# Patient Record
Sex: Female | Born: 1955 | Race: Black or African American | Hispanic: No | Marital: Married
Health system: Midwestern US, Community
[De-identification: ages and names within clinical notes are randomized; demographics above are authoritative.]

## PROBLEM LIST (undated history)

## (undated) DIAGNOSIS — Z124 Encounter for screening for malignant neoplasm of cervix: Secondary | ICD-10-CM

## (undated) DIAGNOSIS — E7800 Pure hypercholesterolemia, unspecified: Secondary | ICD-10-CM

## (undated) DIAGNOSIS — R928 Other abnormal and inconclusive findings on diagnostic imaging of breast: Secondary | ICD-10-CM

## (undated) DIAGNOSIS — M6282 Rhabdomyolysis: Secondary | ICD-10-CM

## (undated) DIAGNOSIS — I10 Essential (primary) hypertension: Secondary | ICD-10-CM

## (undated) DIAGNOSIS — F439 Reaction to severe stress, unspecified: Secondary | ICD-10-CM

## (undated) DIAGNOSIS — E785 Hyperlipidemia, unspecified: Secondary | ICD-10-CM

## (undated) DIAGNOSIS — E789 Disorder of lipoprotein metabolism, unspecified: Secondary | ICD-10-CM

## (undated) HISTORY — DX: Disorder of lipoprotein metabolism, unspecified: E78.9

## (undated) HISTORY — DX: Reaction to severe stress, unspecified: F43.9

## (undated) HISTORY — DX: Hyperlipidemia, unspecified: E78.5

## (undated) MED ORDER — PRAVASTATIN 20 MG TAB
20 mg | ORAL_TABLET | ORAL | Status: DC
Start: ? — End: 2013-04-20

## (undated) MED ORDER — CYCLOBENZAPRINE 10 MG TAB
10 mg | ORAL_TABLET | Freq: Every day | ORAL | Status: DC | PRN
Start: ? — End: 2012-10-07

## (undated) MED ORDER — METOPROLOL SUCCINATE SR 25 MG 24 HR TAB
25 mg | ORAL_TABLET | Freq: Every day | ORAL | Status: DC
Start: ? — End: 2014-04-22

## (undated) MED ORDER — METOPROLOL SUCCINATE SR 25 MG 24 HR TAB
25 mg | ORAL_TABLET | Freq: Every day | ORAL | Status: DC
Start: ? — End: 2013-04-20

## (undated) MED ORDER — PRAVASTATIN 20 MG TAB
20 mg | ORAL_TABLET | Freq: Every evening | ORAL | Status: DC
Start: ? — End: 2012-11-05

---

## 2001-04-15 HISTORY — PX: BURN DEBRIDEMENT SURGERY: SHX582

## 2008-06-25 LAB — METABOLIC PANEL, COMPREHENSIVE
A-G Ratio: 1 — ABNORMAL LOW (ref 1.1–2.2)
ALT (SGPT): 32 U/L (ref 30–65)
AST (SGOT): 14 U/L — ABNORMAL LOW (ref 15–37)
Albumin: 3.2 g/dL — ABNORMAL LOW (ref 3.5–5.0)
Alk. phosphatase: 111 U/L (ref 50–136)
Anion gap: 3 mmol/L — ABNORMAL LOW (ref 5–15)
BUN/Creatinine ratio: 14 (ref 12–20)
BUN: 13 MG/DL (ref 6–20)
Bilirubin, total: 0.2 MG/DL (ref 0.2–1.0)
CO2: 28 MMOL/L (ref 21–32)
Calcium: 8.3 MG/DL — ABNORMAL LOW (ref 8.5–10.1)
Chloride: 111 MMOL/L — ABNORMAL HIGH (ref 97–108)
Creatinine: 0.9 MG/DL (ref 0.6–1.3)
GFR est AA: >60 mL/min/{1.73_m2} (ref >60)
GFR est non-AA: >60 mL/min/{1.73_m2} (ref >60)
Globulin: 3.3 g/dL (ref 2.0–4.0)
Glucose: 90 MG/DL (ref 50–100)
Potassium: 4.1 MMOL/L (ref 3.5–5.1)
Protein, total: 6.5 g/dL (ref 6.4–8.2)
Sodium: 142 MMOL/L (ref 136–145)

## 2008-06-25 LAB — CBC WITH AUTOMATED DIFF
ABS. BASOPHILS: 0 10*3/uL (ref 0.0–0.1)
ABS. EOSINOPHILS: 0.1 10*3/uL (ref 0.0–0.4)
ABS. LYMPHOCYTES: 2.5 10*3/uL (ref 0.8–3.5)
ABS. MONOCYTES: 0.4 10*3/uL (ref 0.0–1.0)
ABS. NEUTROPHILS: 2.9 10*3/uL (ref 1.8–8.0)
BASOPHILS: 0 % (ref 0–1)
EOSINOPHILS: 2 % (ref 0–7)
HCT: 34.5 % — ABNORMAL LOW (ref 35.0–47.0)
HGB: 10.8 g/dL — ABNORMAL LOW (ref 11.5–16.0)
LYMPHOCYTES: 42 % (ref 12–49)
MCH: 28.9 PG (ref 26.0–34.0)
MCHC: 31.3 g/dL (ref 30.0–36.5)
MCV: 92.2 FL (ref 80.0–99.0)
MONOCYTES: 7 % (ref 5–13)
NEUTROPHILS: 49 % (ref 32–75)
PLATELET: 188 10*3/uL (ref 150–400)
RBC: 3.74 M/uL — ABNORMAL LOW (ref 3.80–5.20)
RDW: 12.8 % (ref 11.5–14.5)
WBC: 6 10*3/uL (ref 3.6–11.0)

## 2008-06-25 LAB — CK W/ REFLX CKMB: CK: 168 U/L (ref 21–215)

## 2008-06-25 LAB — TROPONIN I: Troponin-I, Qt.: <0.04 ng/mL (ref 0.0–0.05)

## 2008-06-25 NOTE — H&P (Signed)
Name: MIKE, BERNTSEN Admitted: 06/25/2008  MR #: [id number] DOB: 05-Dec-1955  Account #: [id number] Age: 53  Physician: Butch Penny, M.D. Location: [id number]     HISTORY PHYSICAL      CHIEF COMPLAINT: Chest pain.    HISTORY OF PRESENT ILLNESS: This is a 53 year old female with a family  history of early cardiovascular death (her brother died at age 92 from a  massive myocardial infarction), who came to the emergency room today with  sudden onset of chest discomfort that occurred with activity. She had  recently seen Dr. Alben Spittle at [facility] and underwent an exercise  stress echo on 06/20/2008. That study was remarkable for lateral and  anterior wall hypokinesis noted in the post-exercise imaging portion of the  test. The resting images were normal. She did experience some chest  discomfort in the recovery.    Currently, she pain free. Her chest discomfort earlier was central to left  sided in location, intense, and associated with left arm discomfort and  nausea. The duration was less than 1 hour. By the time she saw the  emergency medical crew, she was without chest discomfort, though found to  be quite hypertensive with a blood pressure of over 170/110.    PAST MEDICAL HISTORY: As above.    ALLERGIES: PENICILLIN.    MEDICATIONS: None.    SOCIAL HISTORY: No tobacco, alcohol, or illicit drug use stated. Her  children are present in the room on interview.    FAMILY HISTORY: Positive as above for cardiac death.    PHYSICAL EXAMINATION:  VITAL SIGNS: Temperature 98.2 Fahrenheit, pulse 74, blood pressure 133/94,  respirations 18, O2 saturation is 100% on room air.  GENERAL: Nondiaphoretic, but not in acute distress, middle-aged female.  NECK: Supple. No palpable thyromegaly.  HEENT: No scleral icterus, mucous membranes are moist, conjunctivae are  pink.  RESPIRATORY: Unlabored, clear to auscultation bilaterally.  CARDIAC: Regular rate and rhythm. No murmur, rub, or gallop. No JVD or   peripheral edema. No carotid bruit. Palpable radial pulses bilaterally. Marland Kitchen  EXTREMITIES: No cyanosis or clubbing.  NEUROLOGIC: Awake, appropriate, grossly nonfocal. No resting tremor.    LABORATORY DATA: White blood cells 6.0, hematocrit 35, platelets 188,  sodium 142, potassium 4.1, chloride 111, bicarbonate 28, BUN 18, creatinine  0.9, glucose 90. Hepatic panel grossly normal. CK 168, troponin less than  0.04.    ECG: Sinus rhythm, normal ECG.    TELEMETRY: No events.    CHEST X-RAY: No acute disease.    IMPRESSION:  1. Chest pain syndrome, possibly due to myocardial ischemia. This would  make this unstable angina.  2. History of positive stress echo with anterior and lateral hypokinesis  in the recovery period as well as chest pain associated with this. Cardiac  catheterization would be the next logical step.  3. Hypertension, severe, resolved.  4. Family history of early cardiovascular death.    RECOMMENDATIONS:  1. Will initiate aspirin and beta-blocker therapy.  2. Continue oxygen by nasal cannula.  3. Nitroglycerin sublingual as needed.  4. Lovenox 1 mg/kg subcu q.12 h.  5. Will check a fasting lipid panel and start statin therapy as  appropriate.  6. Cycle cardiac markers.  7. I would plan on a cardiac catheterization in the next 24 to 48 hours.        E-Signed By  Butch Penny, M.D. 07/07/2008 22:52    Butch Penny, M.D.    cc: Butch Penny, M.D.   Myrene Buddy  Sidonie Dickens, M.D.        DAS/WMX; D: 06/25/2008 5:58 P; T: 06/25/2008 8:55 P; DOC# [id number]; Job#  [id number]

## 2008-06-26 LAB — CK: CK: 154 U/L (ref 21–215)

## 2008-06-26 LAB — TROPONIN I: Troponin-I, Qt.: <0.04 ng/mL (ref 0.0–0.05)

## 2008-06-27 LAB — TROPONIN I: Troponin-I, Qt.: <0.04 ng/mL (ref 0.0–0.05)

## 2008-06-27 LAB — METABOLIC PANEL, BASIC
Anion gap: 3 mmol/L — ABNORMAL LOW (ref 5–15)
BUN/Creatinine ratio: 17 (ref 12–20)
BUN: 17 MG/DL (ref 6–20)
CO2: 30 MMOL/L (ref 21–32)
Calcium: 8.9 MG/DL (ref 8.5–10.1)
Chloride: 107 MMOL/L (ref 97–108)
Creatinine: 1 MG/DL (ref 0.6–1.3)
GFR est AA: >60 mL/min/{1.73_m2} (ref >60)
GFR est non-AA: >60 mL/min/{1.73_m2} (ref >60)
Glucose: 100 MG/DL (ref 50–100)
Potassium: 3.8 MMOL/L (ref 3.5–5.1)
Sodium: 140 MMOL/L (ref 136–145)

## 2008-06-27 LAB — LIPID PANEL
CHOL/HDL Ratio: 4.6 (ref 0–5.0)
Cholesterol, total: 183 MG/DL (ref <200)
HDL Cholesterol: 40 MG/DL (ref 40–60)
LDL, calculated: 118 MG/DL — ABNORMAL HIGH (ref 0–100)
Triglyceride: 125 MG/DL (ref 30–200)
VLDL, calculated: 25 MG/DL

## 2008-06-27 LAB — CK: CK: 87 U/L (ref 21–215)

## 2008-06-28 NOTE — Discharge Summary (Signed)
Name: Melody Harrison, Melody Harrison Admitted: 06/25/2008  MR #: [id number] Discharged: 06/27/2008  Account #: [id number] DOB: 08-11-1955  Physician: Radene Ou, MD Age 53     DISCHARGE SUMMARY      PRIMARY DIAGNOSES  1. Chest pain, with cardiac catheterization showing angiographically  normal coronary arteries.  2. Normal left ventricular function.  3. Sinus rhythm.  4. Newly diagnosed hypertension.  5. Dyslipidemia.  6. Strong family history of coronary disease.  7. Tobacco use.    DISCHARGE MEDICATIONS  1. Aspirin 81 mg daily.  2. Metoprolol sustained release 25 mg daily.  3. Pravachol 20 mg daily.  4. Pepcid AC as needed.    DISCHARGE DIET: Low fat.    DISCHARGE INSTRUCTIONS: The patient was advised no strenuous activity x3  days. The patient is asked to report fever, pain, or bleeding, and he is  advised on tobacco cessation.    The patient will follow up with Dr. Radene Ou in 7 to 10 days.    HOSPITAL COURSE: Please see full history and physical dictated on  06/25/2008; however, briefly, the patient is a 89 year old whose brother  recently died from cardiac disease. She has been under a fair amount of  stress at work and has been complaining of chest discomfort. She had a  stress echocardiogram, which was concerning for possible lateral ischemia,  and came to the hospital with worsening chest pain. She had no diagnostic  EKG changes of ischemia, and negative cardiac enzymes, and stayed in the  hospital for an elective cardiac catheterization. Her cardiac  catheterization was significant for angiographically normal coronary  arteries with preserved left ventricular function. Post-procedure, her  course was uncomplicated. The patient will continue aggressive medical  care.    PROCEDURES: Cardiac catheterization, dictated separately.        Reviewed on 06/28/2008 2:48 PM        E-Signed By  Radene Ou, MD 08/07/2008 21:01    Radene Ou, MD    cc: Radene Ou, MD         SR/WMX; D: 06/27/2008 11:26 A; T: 06/28/2008 11:23 A; DOC# [id number]; Job#  [id number]

## 2011-06-05 MED ORDER — METOPROLOL SUCCINATE SR 25 MG 24 HR TAB
25 mg | ORAL_TABLET | Freq: Every day | ORAL | Status: DC
Start: 2011-06-05 — End: 2012-08-24

## 2011-06-05 MED ORDER — METOPROLOL SUCCINATE SR 25 MG 24 HR TAB
25 mg | ORAL_TABLET | Freq: Every day | ORAL | Status: DC
Start: 2011-06-05 — End: 2011-06-05
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
## 2011-06-05 NOTE — Patient Instructions (Addendum)
MyChart Activation    Thank you for requesting access to MyChart. Please follow the instructions below to securely access and download your online medical record. MyChart allows you to send messages to your doctor, view your test results, renew your prescriptions, schedule appointments, and more.    How Do I Sign Up?    1. In your internet browser, go to [email or web address]  2. Click on the First Time User? Click Here link in the Sign In box. You will be redirect to the New Member Sign Up page.  3. Enter your MyChart Access Code exactly as it appears below. You will not need to use this code after you???ve completed the sign-up process. If you do not sign up before the expiration date, you must request a new code.    MyChart Access Code: [email or web address]  Expires: 09/03/2011  2:18 PM (This is the date your MyChart access code will expire)    4. Enter the last four digits of your Social Security Number (xxxx) and Date of Birth (mm/dd/yyyy) as indicated and click Submit. You will be taken to the next sign-up page.  5. Create a MyChart ID. This will be your MyChart login ID and cannot be changed, so think of one that is secure and easy to remember.  6. Create a MyChart password. You can change your password at any time.  7. Enter your Password Reset Question and Answer. This can be used at a later time if you forget your password.   8. Enter your e-mail address. You will receive e-mail notification when new information is available in MyChart.  9. Click Sign Up. You can now view and download portions of your medical record.  10. Click the Download Summary menu link to download a portable copy of your medical information.    Additional Information    If you have questions, please visit the Frequently Asked Questions section of the MyChart website at [email or web address] Remember, MyChart is NOT to be used for urgent needs. For medical emergencies, dial 911.        Earwax Blockage: After Your Visit   Your Care Instructions  Earwax is a natural substance that protects the ear canal. Normally, earwax drains from the ears and does not cause problems. Sometimes earwax builds up and hardens. Earwax blockage (also called cerumen impaction) can cause some loss of hearing and pain. When wax is tightly packed, you will need to have your doctor remove it.  Follow-up care is a key part of your treatment and safety. Be sure to make and go to all appointments, and call your doctor if you are having problems. It???s also a good idea to know your test results and keep a list of the medicines you take.  How can you care for yourself at home?  ?? Do not try to remove earwax with cotton swabs, fingers, or other objects. This can make the blockage worse and damage the eardrum.   ?? If your doctor recommends that you try to remove earwax at home:   ?? Soften and loosen the earwax with warm mineral oil. You also can try hydrogen peroxide mixed with an equal amount of room temperature water. Place 2 drops of the fluid, warmed to body temperature, in the ear two times a day for up to 5 days.   ?? Once the wax is loose and soft, all that is usually needed to remove it from the ear canal is a gentle, warm shower. Direct the water into the ear,  then tip your head to let the earwax drain out. Dry your ear thoroughly with a hair dryer set on low. Hold the dryer several inches from your ear.   ?? If the warm mineral oil and shower do not work, use an over-the-counter wax softener followed by gentle flushing with an ear syringe each night for a week or two. Make sure the flushing solution is body temperature. Cool or hot fluids in the ear can cause dizziness.   When should you call for help?  Call your doctor now or seek immediate medical care if:  ?? Pus or blood drains from your ear.   ?? Your ears are ringing or feel full.   ?? You have a loss of hearing.   Watch closely for changes in your health, and be sure to contact your doctor if:  ?? You  have pain or reduced hearing after 1 week of home treatment.   ?? You have any new symptoms, such as nausea or balance problems.     Where can you learn more?    Go to [email or web address]   Enter Q495 in the search box to learn more about "Earwax Blockage: After Your Visit."    ?? 2006-2012 Healthwise, Incorporated. Care instructions adapted under license by [employer] (which disclaims liability or warranty for this information). This care instruction is for use with your licensed healthcare professional. If you have questions about a medical condition or this instruction, always ask your healthcare professional. Healthwise, Incorporated disclaims any warranty or liability for your use of this information.  Content Version: 9.5.76532; Last Revised: July 10, 2009

## 2011-06-05 NOTE — Progress Notes (Signed)
Pt is here to establish care.

## 2011-08-06 LAB — AMB POC URINALYSIS DIP STICK AUTO W/O MICRO
Bilirubin (UA POC): NEGATIVE
Glucose (UA POC): NEGATIVE
Ketones (UA POC): NEGATIVE
Nitrites (UA POC): POSITIVE
Protein (UA POC): NEGATIVE mg/dL
Specific gravity (UA POC): 1.02 (ref 1.001–1.035)
Urobilinogen (UA POC): 0.2 (ref 0.2–1)
pH (UA POC): 5.5 (ref 4.6–8.0)

## 2011-08-06 MED ORDER — TRIMETHOPRIM-SULFAMETHOXAZOLE 160 MG-800 MG TAB
160-800 mg | ORAL_TABLET | Freq: Two times a day (BID) | ORAL | Status: AC
Start: 2011-08-06 — End: 2011-08-11

## 2011-08-06 NOTE — Progress Notes (Signed)
Patient c/o urinary burning with urination since yesterday.
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
## 2011-08-06 NOTE — Patient Instructions (Signed)
Urinary Tract Infection in Women: After Your Visit  Your Care Instructions  A urinary tract infection, or UTI, is a general term for an infection anywhere between the kidneys and the urethra (where urine comes out). Most UTIs are bladder infections. They often cause pain or burning when you urinate.  UTIs are caused by bacteria and can be cured with antibiotics. Be sure to complete your treatment so that the infection goes away.  Follow-up care is a key part of your treatment and safety. Be sure to make and go to all appointments, and call your doctor if you are having problems. It???s also a good idea to know your test results and keep a list of the medicines you take.  How can you care for yourself at home?  ?? Take your antibiotics as directed. Do not stop taking them just because you feel better. You need to take the full course of antibiotics.  ?? Drink extra water and other fluids for the next day or two. This may help wash out the bacteria that are causing the infection. (If you have kidney, heart, or liver disease and have to limit fluids, talk with your doctor before you increase your fluid intake.)  ?? Avoid drinks that are carbonated or have caffeine. They can irritate the bladder.  ?? Urinate often. Try to empty your bladder each time.  ?? To relieve pain, take a hot bath or lay a heating pad set on low over your lower belly or genital area. Never go to sleep with a heating pad in place.  To prevent UTIs  ?? Drink plenty of water each day. This helps you urinate often, which clears bacteria from your system. (If you have kidney, heart, or liver disease and have to limit fluids, talk with your doctor before you increase your fluid intake.)  ?? Consider adding cranberry juice to your diet.  ?? Urinate when you need to.  ?? Urinate right after you have sex.  ?? Change sanitary pads often.  ?? Avoid douches, bubble baths, feminine hygiene sprays, and other feminine hygiene products that have deodorants.  ?? After going  to the bathroom, wipe from front to back.  When should you call for help?  Call your doctor now or seek immediate medical care if:  ?? Symptoms such as fever, chills, nausea, or vomiting get worse or appear for the first time.  ?? You have new pain in your back just below your rib cage. This is called flank pain.  ?? There is new blood or pus in your urine.  ?? You have any problems with your antibiotic medicine.  Watch closely for changes in your health, and be sure to contact your doctor if:  ?? You are not getting better after 1 day (24 hours).  ?? Your symptoms go away but then come back.   Where can you learn more?   Go to http://www.healthwise.net/[facility]  Enter K848 in the search box to learn more about "Urinary Tract Infection in Women: After Your Visit."   ?? 2006-2013 Healthwise, Incorporated. Care instructions adapted under license by [employer] (which disclaims liability or warranty for this information). This care instruction is for use with your licensed healthcare professional. If you have questions about a medical condition or this instruction, always ask your healthcare professional. Healthwise, Incorporated disclaims any warranty or liability for your use of this information.  Content Version: 9.6.101520; Last Revised: Aug 29, 2009

## 2011-08-10 LAB — CULTURE, URINE

## 2011-08-11 NOTE — Progress Notes (Signed)
Quick Note:    Please call patient. UC is growing out 2 bacteria. 1 is susceptible to current med (bactrim) and other did not have sensitivities. Hopefully she is feeling better. I tried calling her over the weekend but have not spoken to her.  ______

## 2011-08-12 NOTE — Progress Notes (Signed)
Quick Note:    LMTCB  ______

## 2011-08-13 NOTE — Progress Notes (Signed)
Quick Note:    LMTCB on private vmb.  ______

## 2011-08-20 NOTE — Progress Notes (Signed)
Quick Note:    LMTCB  ______

## 2011-08-22 NOTE — Progress Notes (Signed)
Quick Note:    Okay to send letter.  Notify her she is grew 2 bacteria. She should be feeling better if not should contact us.    Mailed letter to pt's upon Dr.Hayes' approval, regarding recent urine cx.  ______

## 2011-10-02 ENCOUNTER — Other Ambulatory Visit

## 2011-10-02 MED ORDER — CELECOXIB 200 MG CAP
200 mg | ORAL_CAPSULE | Freq: Every day | ORAL | Status: DC
Start: 2011-10-02 — End: 2012-04-01

## 2011-10-02 MED ORDER — CELECOXIB 200 MG CAP
200 mg | ORAL_CAPSULE | Freq: Every day | ORAL | Status: DC
Start: 2011-10-02 — End: 2011-10-02
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
## 2011-10-02 NOTE — Patient Instructions (Addendum)
I am suggesting people try to get in with another partner--Dr. Deanna Artis or Dr. Cliffton Asters. We do not have anyone to take over for me at this point, so the availability is limited. If you really want to stay in the practice, I recommend calling sooner than later to get a "re-establish" appointment on the books ASAP. Otherwise, please consider [facility], or [facility]. These are quality practices, in [facility], so all your records will be available.      Dr. Samuel Bouche, Dr. Thresa Ross, Dr. Sherryll Burger  [facility]  Phone: [phone]    [place]: Dr. [facility]  Phone: [phone]    [facility]--Dr. Jackolyn Confer, Dr. Emelia Salisbury  Phone: [phone]   [address]   [address]   [place] [address]      Low Back Pain: Exercises  Your Care Instructions  Here are some examples of typical rehabilitation exercises for your condition. Start each exercise slowly. Ease off the exercise if you start to have pain.  Your doctor or physical therapist will tell you when you can start these exercises and which ones will work best for you.  How to do the exercises  Press-up    1. Lie on your stomach, supporting your body with your forearms.   2. Press your elbows down into the floor to raise your upper back. As you do this, relax your stomach muscles and allow your back to arch without using your back muscles. As your press up, do not let your hips or pelvis come off the floor.   3. Hold for 15 to 30 seconds, then relax.   4. Repeat 2 to 4 times.   Alternate arm and leg (bird dog) exercise    Note: Do this exercise slowly. Try to keep your body straight at all times, and do not let one hip drop lower than the other.  1. Start on the floor, on your hands and knees.   2. Tighten your belly muscles.   3. Raise one leg off the floor, and hold it straight out behind  you. Be careful not to let your hip drop down, because that will twist your trunk.   4. Hold for about 6 seconds, then lower your leg and switch to the other leg.   5. Repeat 8 to 12 times on each leg.   6. Over time, work up to holding for 10 to 30 seconds each time.   7. If you feel stable and secure with your leg raised, try raising the opposite arm straight out in front of you at the same time.   Knee-to-chest exercise    1. Lie on your back with your knees bent and your feet flat on the floor.   2. Bring one knee to your chest, keeping the other foot flat on the floor (or keeping the other leg straight, whichever feels better on your lower back).   3. Keep your lower back pressed to the floor. Hold for at least 15 to 30 seconds.   4. Relax, and lower the knee to the starting position.   5. Repeat with the other leg. Repeat 2 to 4 times with each leg.   6. To get more stretch, put your other leg flat on the floor while pulling your knee to your chest.   Curl-ups    1. Lie on the floor on your back  with your knees bent at a 90-degree angle. Your feet should be flat on the floor, about 12 inches from your buttocks.   2. Cross your arms over your chest.   3. Slowly tighten your belly muscles and raise your shoulder blades off the floor.   4. Keep your head in line with your body, and do not press your chin to your chest.   5. Hold this position for 1 or 2 seconds, then slowly lower yourself back down to the floor.   6. Repeat 8 to 12 times.   Pelvic tilt exercise    1. Lie on your back with your knees bent.   2. "Brace" your stomach. This means to tighten your muscles by pulling in and imagining your belly button moving toward your spine. You should feel like your back is pressing to the floor and your hips and pelvis are rocking back.   3. Hold for about 6 seconds while you breathe smoothly.   4. Repeat 8 to 12 times.   Heel dig bridging    1. Lie on your back with both knees bent and your ankles bent so that only  your heels are digging into the floor. Your knees should be bent about 90 degrees.   2. Then push your heels into the floor, squeeze your buttocks, and lift your hips off the floor until your shoulders, hips, and knees are all in a straight line.   3. Hold for about 6 seconds as you continue to breathe normally, and then slowly lower your hips back down to the floor and rest for up to 10 seconds.   4. Do 8 to 12 repetitions.   Hamstring stretch in doorway    1. Lie on your back in a doorway, with one leg through the open door.   2. Slide your leg up the wall to straighten your knee. You should feel a gentle stretch down the back of your leg.   3. Hold the stretch for at least 15 to 30 seconds. Do not arch your back, point your toes, or bend either knee. Keep one heel touching the floor and the other heel touching the wall.   4. Repeat with your other leg.   5. Do 2 to 4 times for each leg.   Hip flexor stretch    1. Kneel on the floor with one knee bent and one leg behind you. Place your forward knee over your foot. Keep your other knee touching the floor.   2. Slowly push your hips forward until you feel a stretch in the upper thigh of your rear leg.   3. Hold the stretch for at least 15 to 30 seconds. Repeat with your other leg.   4. Do 2 to 4 times on each side.   Wall sit    1. Stand with your back 10 to 12 inches away from a wall.   2. Lean into the wall until your back is flat against it.   3. Slowly slide down until your knees are slightly bent, pressing your lower back into the wall.   4. Hold for about 6 seconds, then slide back up the wall.   5. Repeat 8 to 12 times.   Follow-up care is a key part of your treatment and safety. Be sure to make and go to all appointments, and call your doctor if you are having problems. It's also a good idea to know your test results and keep a list of  the medicines you take.    Where can you learn more?    Go to [facility]   Enter [phone] in the  search box to learn more about "Low Back Pain: Exercises."    ?? 2006-2013 [employer]. Care instructions adapted under license by [employer] (which disclaims liability or warranty for this information). This care instruction is for use with your licensed healthcare professional. If you have questions about a medical condition or this instruction, always ask your healthcare professional. [employer], Inc[employer] disclaims any warranty or liability for your use of this information.  Content Version: 9.7.130178; Last Revised: February 27, 2010          Well Visit, Women 50 to 14: After Your Visit  Your Care Instructions  Physical exams can help you stay healthy. Your doctor has checked your overall health and may have suggested ways to take good care of yourself. He or she also may have recommended tests. At home, you can help prevent illness with healthy eating, regular exercise, and other steps.  Follow-up care is a key part of your treatment and safety. Be sure to make and go to all appointments, and call your doctor if you are having problems. It's also a good idea to know your test results and keep a list of the medicines you take.  How can you care for yourself at home?  ?? Reach and stay at a healthy weight. This will lower your risk for many problems, such as obesity, diabetes, heart disease, and high blood pressure.   ?? Get at least 30 minutes of exercise on most days of the week. Walking is a good choice. You also may want to do other activities, such as running, swimming, cycling, or playing tennis or team sports.   ?? Do not smoke. Smoking can make health problems worse. If you need help quitting, talk to your doctor about stop-smoking programs and medicines. These can increase your chances of quitting for good.   ?? Always wear sunscreen on exposed skin. Make sure the sunscreen blocks ultraviolet rays (both UVA and UVB) and has a sun protection factor (SPF) of at least 15. Use it every day, even  when it is cloudy. Some doctors may recommend a higher SPF, such as 30.   ?? See a dentist one or two times a year for checkups and to have your teeth cleaned.   ?? Wear a seat belt in the car.   ?? Limit alcohol to 1 drink a day. Too much alcohol can cause health problems.   Follow your doctor's advice about when to have certain tests. These tests can spot problems early.  ?? Cholesterol. Your doctor will tell you how often to have this done based on your age, family history, or other things that can increase your risk for heart disease.   ?? Blood pressure. Experts suggest that healthy adults with normal blood pressure (119/79 mm Hg or below) have their blood pressure checked at least every 1 to 2 years. This can be done during a routine doctor visit. If you have slightly higher or high blood pressure, or are at risk for heart disease, your doctor will suggest more frequent tests.   ?? Breast exam and mammogram. Ask your doctor how often you should have a clinical breast exam. Have a mammogram every one to two years. A mammogram can spot breast cancer before it can be felt and when it is easiest to treat.   ?? Pap test and pelvic  exam. Ask your doctor how often you should have a Pap test. You may not need to have a Pap test as often as you used to.   ?? Vision. Have your eyes checked every year or two or as often as your doctor suggests. Some experts recommend that you have yearly exams for glaucoma and other age-related eye problems starting at age 73.   ?? Hearing. Tell your doctor if you notice any change in your hearing. You can have tests to find out how well you hear.   ?? Diabetes. Ask your doctor whether you should have tests for diabetes.   ?? Colon cancer. You should begin tests for colon cancer at age 34. You may have one of several tests. Your doctor will tell you how often to have tests based on your age and risk. Risks include whether you already had a precancerous polyp removed from your colon or whether your  parents, sisters and brothers, or children have had colon cancer.   ?? Thyroid disease. Talk to your doctor about whether to have your thyroid checked as part of a regular physical exam. Women have an increased chance of a thyroid problem.   ?? Osteoporosis. You should begin tests for bone density at age 44. If you are younger than 54, ask your doctor whether you have factors that may increase your risk for this disease. You may want to have this test before age 73.   ?? Coronary artery disease. Every 5 years, you should have your risks for heart disease assessed. This test uses factors such as your age, blood pressure, cholesterol, and whether you smoke or have diabetes to show what your risk for a heart attack is over the next 10 years.   When should you call for help?  Watch closely for changes in your health, and be sure to contact your doctor if you have any problems or symptoms that concern you.    Where can you learn more?    Go to [facility]   Enter ([phone] in the search box to learn more about "Well Visit, Women 50 to 65: After Your Visit."    ?? 2006-2013 [employer]wise, [employer]. Care instructions adapted under license by [employer] (which disclaims liability or warranty for this information). This care instruction is for use with your licensed healthcare professional. If you have questions about a medical condition or this instruction, always ask your healthcare professional. [employer], [employer] disclaims any warranty or liability for your use of this information.  Content Version: 9.7.130178; Last Revised: November 08, 2009

## 2011-10-03 LAB — METABOLIC PANEL, COMPREHENSIVE
A-G Ratio: 1.2 (ref 1.1–2.5)
ALT (SGPT): 17 IU/L (ref 0–32)
AST (SGOT): 15 IU/L (ref 0–40)
Albumin: 3.9 g/dL (ref 3.5–5.5)
Alk. phosphatase: 94 IU/L (ref 25–150)
BUN/Creatinine ratio: 14 (ref 9–23)
BUN: 12 mg/dL (ref 6–24)
Bilirubin, total: 0.3 mg/dL (ref 0.0–1.2)
CO2: 23 mmol/L (ref 19–28)
Calcium: 9.4 mg/dL (ref 8.7–10.2)
Chloride: 105 mmol/L (ref 97–108)
Creatinine: 0.85 mg/dL (ref 0.57–1.00)
GFR est non-AA: 78 mL/min/{1.73_m2} (ref >59)
GLOBULIN, TOTAL: 3.3 g/dL (ref 1.5–4.5)
Glucose: 78 mg/dL (ref 65–99)
Potassium: 4.2 mmol/L (ref 3.5–5.2)
Protein, total: 7.2 g/dL (ref 6.0–8.5)
Sodium: 142 mmol/L (ref 134–144)
eGFR If African American: 90 mL/min/{1.73_m2} (ref >59)

## 2011-10-03 LAB — URINALYSIS W/ RFLX MICROSCOPIC
Bilirubin: NEGATIVE
Glucose: NEGATIVE
Ketone: NEGATIVE
Leukocyte Esterase: NEGATIVE
Nitrites: NEGATIVE
Protein: NEGATIVE
Specific Gravity: 1.025 (ref 1.005–1.030)
Urobilinogen: 0.2 mg/dL (ref 0.0–1.9)
pH (UA): 6 (ref 5.0–7.5)

## 2011-10-03 LAB — CBC WITH AUTOMATED DIFF
ABS. BASOPHILS: 0 10*3/uL (ref 0.0–0.2)
ABS. EOSINOPHILS: 0.1 10*3/uL (ref 0.0–0.4)
ABS. IMM. GRANS.: 0 10*3/uL (ref 0.0–0.1)
ABS. MONOCYTES: 0.4 10*3/uL (ref 0.1–1.0)
ABS. NEUTROPHILS: 3.4 10*3/uL (ref 1.8–7.8)
Abs Lymphocytes: 2.5 10*3/uL (ref 0.7–4.5)
BASOPHILS: 0 % (ref 0–3)
EOSINOPHILS: 2 % (ref 0–7)
HCT: 38.6 % (ref 34.0–46.6)
HGB: 12 g/dL (ref 11.1–15.9)
IMMATURE GRANULOCYTES: 0 % (ref 0–2)
Lymphocytes: 39 % (ref 14–46)
MCH: 27.6 pg (ref 26.6–33.0)
MCHC: 31.1 g/dL — ABNORMAL LOW (ref 31.5–35.7)
MCV: 89 fL (ref 79–97)
MONOCYTES: 6 % (ref 4–13)
NEUTROPHILS: 53 % (ref 40–74)
PLATELET: 241 10*3/uL (ref 140–415)
RBC: 4.34 x10E6/uL (ref 3.77–5.28)
RDW: 13.6 % (ref 12.3–15.4)
WBC: 6.3 10*3/uL (ref 4.0–10.5)

## 2011-10-03 LAB — LIPID PANEL
Cholesterol, total: 150 mg/dL (ref 100–199)
HDL Cholesterol: 38 mg/dL — ABNORMAL LOW (ref >39)
LDL, calculated: 92 mg/dL (ref 0–99)
Triglyceride: 102 mg/dL (ref 0–149)
VLDL, calculated: 20 mg/dL (ref 5–40)

## 2011-10-03 LAB — MICROSCOPIC EXAMINATION: Bacteria: NONE SEEN

## 2011-10-03 NOTE — Progress Notes (Signed)
Quick Note:    Letter sent  ______

## 2011-10-04 NOTE — Progress Notes (Signed)
Quick Note:    Letter sent  ______

## 2011-10-23 NOTE — Progress Notes (Signed)
Quick Note:    LMTCB  ______

## 2011-10-23 NOTE — Progress Notes (Signed)
Quick Note:    Please call the patient: The radiologist would like to be cautious and get extra views of the left breast. If she has not heard from the imaging center, she should call to schedule.     ______

## 2011-10-24 NOTE — Progress Notes (Signed)
Quick Note:    Advised pt the radiologist would like to be cautious and get extra views of the left breast. If she has not heard from the imaging center, she should call to schedule - given number.  ______

## 2011-11-06 ENCOUNTER — Encounter

## 2012-01-31 NOTE — Telephone Encounter (Signed)
Pt called with c/o back pain and no relief from Celebrex. Explained to pt she would need to be evaluated by provider prior to having a medication prescribed. Patient verbalized understanding, explained to pt, no available appts here today. Advised to call back first thing Monday morning for an acute slot. Instructed pt to go to urgent care facility if pain worsened over the weekend. Patient verbalized understanding.

## 2012-01-31 NOTE — Telephone Encounter (Signed)
Message copied by Kathleen Argue on Fri Jan 31, 2012 10:09 AM  ------       Message from: Sharla Kidney E       Created: Thu Jan 30, 2012 10:46 AM       Regarding: [facility] - Pt message         Pt was given pills for back pain.  Next appt 12/18 8:30 with Madilyn Fireman.       RX: Celebrex 200mg  - they do not work, only takes one a day.  Advise what to do.  [phone]    [phone]  ------

## 2012-02-05 NOTE — Telephone Encounter (Signed)
Pt stated she is still having back pain, stated she has not been seen for it yet. Explained to pt we have no available appts left today. Advised pt to call back first thing in the am for an acute slot. Patient verbalized understanding.

## 2012-02-05 NOTE — Telephone Encounter (Signed)
Message copied by Kathleen Argue on Wed Feb 05, 2012  1:42 PM  ------       Message from: Myrtie Neither       Created: Tue Feb 04, 2012  9:03 AM       Regarding: KEW/returning Melody Harrison's call         [phone]     [phone]              Patient said she was told to call back on Tuesday and ask for Delice Bison  ------

## 2012-02-05 NOTE — Telephone Encounter (Signed)
Message copied by Kathleen Argue on Wed Feb 05, 2012  1:43 PM  ------       Message from: Myrtie Neither       Created: Tue Feb 04, 2012  9:03 AM       Regarding: KEW/returning Aria Pickrell's call         [phone]     [phone]              Patient said she was told to call back on Tuesday and ask for Delice Bison  ------

## 2012-04-01 NOTE — Progress Notes (Signed)
Follow up for HTN.  Patient declines flu vaccine.

## 2012-04-01 NOTE — Progress Notes (Signed)
HISTORY OF PRESENT ILLNESS  Melody Harrison is a 56 y.o. female.  HPI  She is a new patient and is here to establish care.   Previous followed by Dr Maurine Minister.  The following sections were reviewed & updated as appropriate: PMH, PL, PSH, SH, and allergies.  Works for the [employer].       Cardiovascular Review  The patient has hypertension, hyperlipidemia and obesity.  She reports taking medications as instructed, no medication side effects noted, home BP monitoring in range of 120's systolic over 80's diastolic at work.  Diet and Lifestyle: generally follows a low fat low cholesterol diet, generally follows a low sodium diet, exercises sporadically, (no regular exercise, but is walking & on her feet daily).  Lab review: labs reviewed and discussed with patient.      Orthopedic Review  Melody Harrison is a 56 y.o. female who presents do to back pain. This is a acute on chronic problem, since '98, symptoms have been fluctuating.  She reports sharp lower back pain w/o radiation.  She denies weakness, denies numbness, denies paresthesias.  Initial inciting event: she reports moving a box in the '90's was the original issue but now she notices the pain is worse every 3 months and around her menses.  Pain lasts for 3 days.  Symptoms are constant.  Alleviating factors identifiable by patient are pain medications (husband's narcotics or msk relaxers). Previous workup: none.        Current Outpatient Prescriptions   Medication Sig Dispense Refill   ??? pravastatin (PRAVACHOL) 20 mg tablet Take 20 mg by mouth nightly.       ??? aspirin delayed-release 81 mg tablet Take 81 mg by mouth daily.       ??? multivitamin with iron tablet Take 1 Tab by mouth daily.       ??? vit B Cmplx 3-FA-Vit C-Biotin (NEPHRO VITE RX) 1-60-300 mg-mg-mcg tablet Take 1 Tab by mouth daily.       ??? omega-3 fatty acids-vitamin e (FISH OIL) 1,000 mg cap Take 1 Cap by mouth daily.       ??? metoprolol-XL (TOPROL-XL) 25 mg XL tablet Take 1.5 Tabs by mouth daily.  135  Tab  1       No Known Allergies        Review of Systems   Constitutional: Negative for weight loss and malaise/fatigue.   Eyes: Negative for blurred vision.   Respiratory: Negative for cough and shortness of breath.    Cardiovascular: Negative for chest pain, palpitations and leg swelling.   Gastrointestinal: Negative for heartburn, nausea, vomiting, abdominal pain, diarrhea and constipation.   Genitourinary: Negative for frequency.   Musculoskeletal: Positive for back pain. Negative for myalgias and joint pain.   Skin: Negative for rash.   Neurological: Negative for tingling, sensory change and focal weakness.   Psychiatric/Behavioral: Negative for depression. The patient is not nervous/anxious and does not have insomnia.        Physical Exam   Constitutional: She appears well-developed. No distress.   HENT:   Mouth/Throat: Mucous membranes are normal.   Eyes: Conjunctivae and lids are normal. No scleral icterus.   Neck: Neck supple. No thyromegaly present.   Cardiovascular: Regular rhythm and normal heart sounds.    No murmur heard.  Pulmonary/Chest: Effort normal and breath sounds normal. She has no wheezes. She has no rales.   Abdominal: Soft. Bowel sounds are normal. She exhibits no mass. There is no hepatosplenomegaly. There is no tenderness.  Musculoskeletal: She exhibits no edema.        Lumbar back: She exhibits tenderness. She exhibits normal range of motion, no bony tenderness, no deformity and no pain.        Back:    Lymphadenopathy:     She has no cervical adenopathy.   Neurological:   Strength is 5/5 in lower extremities. Intact to light touch in lower extremities. Straight leg raise was Negative in bilateral LE.    Skin: No rash noted.   Psychiatric: She has a normal mood and affect. Her behavior is normal.       BP 136/90   Pulse 66   Temp(Src) 98 ??F (36.7 ??C) (Oral)   Ht 5\' 10"  (1.778 m)   Wt 220 lb 3.2 oz (99.882 kg)   BMI 31.6 kg/m2   SpO2 95%   LMP 03/28/2012      ASSESSMENT and PLAN   Melody Harrison was seen today for hypertension.  Diagnoses and associated orders for this visit:    Hyperlipidemia- stable, at goal, no changes    Essential hypertension, benign- stable, at goal, no changes    Obesity- unchanged, Discussed the patient's BMI with her.  The BMI follow up plan is as follows: BMI is out of normal parameters and plan is as follows: I have counseled this patient on diet and exercise regimens (reviewed diet more important then exercise).     Abnormal mammogram- due for repeat next month  - MAM MAMMO LT DX DIGTL; Future    Back pain, chronic- due to long duration & location likely muscular.  Will start w/ heat & stretching.  Will have her see PT.  If no changes will need imaging.  Msk relaxer for only severe pain.  Educated not to take other people's medications.  Reviewed correct lifting techniques. Due to previous issues w/ menses will need to check [place].  - REFERRAL TO PHYSICAL THERAPY  - cyclobenzaprine (FLEXERIL) 10 mg tablet; Take 1 Tab by mouth daily as needed for Muscle Spasm(s).        Follow-up Disposition:  Return in about 6 months (around 09/30/2012).   Advised her to call back or return to office if symptoms worsen/change/persist.  Discussed expected course/resolution/complications of diagnosis in detail with patient.    Medication risks/benefits/costs/interactions/alternatives discussed with patient.  She was given an after visit summary which includes diagnoses, current medications, & vitals.  She expressed understanding with the diagnosis and plan.

## 2012-04-01 NOTE — Patient Instructions (Signed)
WEIGHT LOSS RECOMMENDATIONS:    [facility]:               - [address]  [place], [place]   - [phone]   - [email or web address]   - 3 month initial course   - Initial fee + monthly membership fee    [facility]:   - [place] and [place] locations   -PREP (Physician Referred Exercise Program)   - [email or web address]   - Monthly membership fee   -[phone] for 60 days    [facility]   - Dr Valere Dross   - [address]  [place], [place]   - [phone]   - [email or web address]   - Cost: $135 initial visit, $65 follow up visits    Weight Watchers:   - See website    [facility]:   - See website        Low Back Pain: Exercises  Your Care Instructions  Here are some examples of typical rehabilitation exercises for your condition. Start each exercise slowly. Ease off the exercise if you start to have pain.  Your doctor or physical therapist will tell you when you can start these exercises and which ones will work best for you.  How to do the exercises  Press-up    1. Lie on your stomach, supporting your body with your forearms.  2. Press your elbows down into the floor to raise your upper back. As you do this, relax your stomach muscles and allow your back to arch without using your back muscles. As your press up, do not let your hips or pelvis come off the floor.  3. Hold for 15 to 30 seconds, then relax.  4. Repeat 2 to 4 times.  Alternate arm and leg (bird dog) exercise    Note: Do this exercise slowly. Try to keep your body straight at all times, and do not let one hip drop lower than the other.  1. Start on the floor, on your hands and knees.  2. Tighten your belly muscles.  3. Raise one leg off the floor, and hold it straight out behind you. Be careful not to let your hip drop down, because that will twist your trunk.  4. Hold for about 6 seconds, then lower your leg and switch to the other leg.  5. Repeat 8 to 12 times on each leg.  6. Over time, work up to  holding for 10 to 30 seconds each time.  7. If you feel stable and secure with your leg raised, try raising the opposite arm straight out in front of you at the same time.  Knee-to-chest exercise    1. Lie on your back with your knees bent and your feet flat on the floor.  2. Bring one knee to your chest, keeping the other foot flat on the floor (or keeping the other leg straight, whichever feels better on your lower back).  3. Keep your lower back pressed to the floor. Hold for at least 15 to 30 seconds.  4. Relax, and lower the knee to the starting position.  5. Repeat with the other leg. Repeat 2 to 4 times with each leg.  6. To get more stretch, put your other leg flat on the floor while pulling your knee to your chest.  Curl-ups    1. Lie on the floor on your back with your knees bent at a 90-degree angle. Your feet should be flat  on the floor, about 12 inches from your buttocks.  2. Cross your arms over your chest.  3. Slowly tighten your belly muscles and raise your shoulder blades off the floor.  4. Keep your head in line with your body, and do not press your chin to your chest.  5. Hold this position for 1 or 2 seconds, then slowly lower yourself back down to the floor.  6. Repeat 8 to 12 times.  Pelvic tilt exercise    1. Lie on your back with your knees bent.  2. "Brace" your stomach. This means to tighten your muscles by pulling in and imagining your belly button moving toward your spine. You should feel like your back is pressing to the floor and your hips and pelvis are rocking back.  3. Hold for about 6 seconds while you breathe smoothly.  4. Repeat 8 to 12 times.  Heel dig bridging    1. Lie on your back with both knees bent and your ankles bent so that only your heels are digging into the floor. Your knees should be bent about 90 degrees.  2. Then push your heels into the floor, squeeze your buttocks, and lift your hips off the floor until your shoulders, hips, and knees are all in a straight line.   3. Hold for about 6 seconds as you continue to breathe normally, and then slowly lower your hips back down to the floor and rest for up to 10 seconds.  4. Do 8 to 12 repetitions.  Hamstring stretch in doorway    1. Lie on your back in a doorway, with one leg through the open door.  2. Slide your leg up the wall to straighten your knee. You should feel a gentle stretch down the back of your leg.  3. Hold the stretch for at least 15 to 30 seconds. Do not arch your back, point your toes, or bend either knee. Keep one heel touching the floor and the other heel touching the wall.  4. Repeat with your other leg.  5. Do 2 to 4 times for each leg.  Hip flexor stretch    1. Kneel on the floor with one knee bent and one leg behind you. Place your forward knee over your foot. Keep your other knee touching the floor.  2. Slowly push your hips forward until you feel a stretch in the upper thigh of your rear leg.  3. Hold the stretch for at least 15 to 30 seconds. Repeat with your other leg.  4. Do 2 to 4 times on each side.  Wall sit    1. Stand with your back 10 to 12 inches away from a wall.  2. Lean into the wall until your back is flat against it.  3. Slowly slide down until your knees are slightly bent, pressing your lower back into the wall.  4. Hold for about 6 seconds, then slide back up the wall.  5. Repeat 8 to 12 times.  Follow-up care is a key part of your treatment and safety. Be sure to make and go to all appointments, and call your doctor if you are having problems. It's also a good idea to know your test results and keep a list of the medicines you take.   Where can you learn more?   Go to [facility]  Enter [phone] in the search box to learn more about "Low Back Pain: Exercises."   ?? 2006-2013 Healthwise, Incorporated. Care instructions adapted under  license by [employer] (which disclaims liability or warranty for this information). This care instruction is for use with your licensed  healthcare professional. If you have questions about a medical condition or this instruction, always ask your healthcare professional. Healthwise, Incorporated disclaims any warranty or liability for your use of this information.  Content Version: 9.8.193578; Last Revised: February 27, 2010      PHYSICAL THERAPY    [facility]   [address]   [phone]    [facility]   [address]   [phone]    [facility]   [address]   [phone]   [address]   [phone]    [facility]   [address] [place], [place]   [phone]    [facility]   [address] ([place])   [phone]    [facility]   Multiple Locations    [phone]

## 2012-08-24 ENCOUNTER — Encounter

## 2012-08-24 NOTE — Telephone Encounter (Signed)
Pharm refill request

## 2012-10-07 NOTE — Progress Notes (Signed)
Follow up for HTN; cholesterol.

## 2012-10-07 NOTE — Progress Notes (Signed)
HISTORY OF PRESENT ILLNESS  Melody Harrison is a 57 y.o. female.  HPI  She reports she will be moving to [place].  Her chart was reviewed: PMH, PSH, and SH were updated.    Cardiovascular Review  The patient has hypertension, hyperlipidemia and obesity.  She reports taking medications as instructed, no medication side effects noted, patient does not perform home BP monitoring.  Diet and Lifestyle: generally follows a low fat low cholesterol diet, generally follows a low sodium diet, exercises sporadically, she has lost 8-9 lbs with diet & exercise changes.  Lab review: labs reviewed and discussed with patient.        Pulmonary Review  The patient is being seen for tobacco abuse.  She admits to smoking 3-4 cig/day.  She does not desire to quit.  Current limitations in activity: none.  Coughing when present is described as none.  She only smokes at work.         Prior to Admission medications    Medication Sig Start Date End Date Taking? Authorizing Provider   metoprolol succinate (TOPROL-XL) 25 mg XL tablet Take 1.5 Tabs by mouth daily. 08/24/12  Yes Bud Face, MD   pravastatin (PRAVACHOL) 20 mg tablet Take 20 mg by mouth nightly.   Yes Historical Provider   aspirin delayed-release 81 mg tablet Take 81 mg by mouth daily.   Yes Historical Provider   multivitamin with iron tablet Take 1 Tab by mouth daily.   Yes Historical Provider   vit B Cmplx 3-FA-Vit C-Biotin (NEPHRO VITE RX) 1-60-300 mg-mg-mcg tablet Take 1 Tab by mouth daily.   Yes Historical Provider   omega-3 fatty acids-vitamin e (FISH OIL) 1,000 mg cap Take 1 Cap by mouth daily.   Yes Historical Provider          No Known Allergies       Review of Systems   Constitutional: Negative for weight loss and malaise/fatigue.   Eyes: Negative for blurred vision.   Respiratory: Negative for cough and shortness of breath.    Cardiovascular: Negative for chest pain, palpitations and leg swelling.   Gastrointestinal: Negative for heartburn, nausea, vomiting,  abdominal pain, diarrhea and constipation.   Genitourinary: Negative for frequency.   Musculoskeletal: Negative for myalgias and joint pain.   Skin: Negative for rash.   Neurological: Negative for tingling, sensory change, focal weakness and headaches.   Endo/Heme/Allergies: Does not bruise/bleed easily.   Psychiatric/Behavioral: Negative for depression. The patient is not nervous/anxious and does not have insomnia.        Physical Exam   Constitutional:   obese   Eyes: Conjunctivae are normal. No scleral icterus.   Cardiovascular: Regular rhythm and normal heart sounds.  Bradycardia present.    No murmur heard.  Pulmonary/Chest: Effort normal and breath sounds normal. She has no wheezes. She has no rales.   Abdominal: Bowel sounds are normal. She exhibits no mass. There is no hepatosplenomegaly. There is no tenderness.   Musculoskeletal: She exhibits no edema.   Psychiatric: She has a normal mood and affect. Her behavior is normal.       BP 128/78   Pulse 59   Ht 5\' 10"  (1.778 m)   Wt 212 lb 9.6 oz (96.435 kg)   BMI 30.51 kg/m2   SpO2 99%   LMP 03/28/2012      ASSESSMENT and PLAN  Melody Harrison was seen today for hypertension.  Diagnoses and associated orders for this visit:    Essential hypertension, benign- well  controlled, continue current treatment.   - METABOLIC PANEL, COMPREHENSIVE    Hyperlipidemia- well controlled, continue current treatment pending review of labs.   - METABOLIC PANEL, COMPREHENSIVE  - LIPID PANEL    Obesity- improved slightly. Discussed the patient's above normal BMI with her.  The BMI follow up plan is as follows: BMI is out of normal parameters and plan is as follows: I have counseled this patient on diet and exercise regimens     Tobacco use- minimal use, habit more hen addiction, reviewed going out for her smoke break but not smoking.  The patient was counseled on the dangers of tobacco use, and was advised to quit.  Reviewed strategies to maximize success, including stress management,  substitution of other forms of reinforcement and support of family/friends.      H/O abnormal mammogram- reviewed previous US and mammo, will restart yearly mammo's  - MAM MAMMO BI DX DIGTL; Future          Follow-up Disposition:  Return in about 1 year (around 10/07/2013) for FULL PHYSICAL - 30 minutes.   Advised her to call back or return to office if symptoms worsen/change/persist.  Discussed expected course/resolution/complications of diagnosis in detail with patient.    Medication risks/benefits/costs/interactions/alternatives discussed with patient.  She was given an after visit summary which includes diagnoses, current medications, & vitals.  She expressed understanding with the diagnosis and plan.

## 2012-10-07 NOTE — Patient Instructions (Signed)
WEIGHT LOSS RECOMMENDATIONS:    Weight Watchers:   - See website    Jenny Craig:   - See website    New Technology:   - My Fitness Pal or other Apps for your phone   - FitBit or FuelBand      Aerobic exercise: goal of 3-5 times per week, about 30 minutes    Diet changes: limiting daily calorie intake to 2,000.  Work on reading nutrition labels on food (in particular the serving size, the calories per serving, and carbohydrates).    Starting a Weight Loss Plan: After Your Visit  Your Care Instructions  If you are thinking about losing weight, it can be hard to know where to start. Your doctor can help you set up a weight loss plan that best meets your needs. You may want to take a class on nutrition or exercise, or join a weight loss support group. If you have questions about how to make changes to your eating or exercise habits, ask your doctor about seeing a registered dietitian or an exercise specialist.  It can be a big challenge to lose weight. But you do not have to make huge changes at once. Make small changes, and stick with them. When those changes become habit, add a few more changes.  If you do not think you are ready to make changes right now, try to pick a date in the future. Make an appointment to see your doctor to discuss whether the time is right for you to start a plan.  Follow-up care is a key part of your treatment and safety. Be sure to make and go to all appointments, and call your doctor if you are having problems. It???s also a good idea to know your test results and keep a list of the medicines you take.  How can you care for yourself at home?  ?? Set realistic goals. Many people expect to lose much more weight than is likely. A weight loss of 5% to 10% of your body weight may be enough to improve your health.  ?? Get family and friends involved to provide support. Talk to them about why you are trying to lose weight, and ask them to help. They can help by participating in exercise and having  meals with you, even if they may be eating something different.  ?? Find what works best for you. If you do not have time or do not like to cook, a program that offers meal replacement bars or shakes may be better for you. Or if you like to prepare meals, finding a plan that includes daily menus and recipes may be best.  ?? Ask your doctor about other health professionals who can help you achieve your weight loss goals.  ?? A dietitian can help you make healthy changes in your diet.  ?? An exercise specialist or personal trainer can help you develop a safe and effective exercise program.  ?? A counselor or psychiatrist can help you cope with issues such as depression, anxiety, or family problems that can make it hard to focus on weight loss.  ?? Consider joining a support group for people who are trying to lose weight. Your doctor can suggest groups in your area.   Where can you learn more?   Go to http://www.healthwise.net/[facility]  Enter U357 in the search box to learn more about "Starting a Weight Loss Plan: After Your Visit."   ?? 2006-2014 Healthwise, Incorporated. Care instructions adapted under license   by [employer] Christ[employer] (which disclaims liability or warranty for this information). This care instruction is for use with your licensed healthcare professional. If you have questions about a medical condition or this instruction, always ask your healthcare professional. [place] any warranty or liability for your use of this information.  Content Version: 10.0.270728; Last Revised: November 19, 2011

## 2012-10-09 NOTE — Telephone Encounter (Signed)
Requested Prescriptions     Pending Prescriptions Disp Refills   ??? pravastatin (PRAVACHOL) 20 mg tablet       Sig: Take 1 Tab by mouth nightly.

## 2012-10-09 NOTE — Telephone Encounter (Signed)
LMTCB

## 2012-10-09 NOTE — Telephone Encounter (Signed)
She was given labs at last visit (2 days ago) and told last labs were from 1 year ago.  Needs to get labs done prior to refilling meds.  I thought she was getting these done the other day.  30 days sent to pharmacy but will refill for 6 months once labs reviewed

## 2012-10-12 NOTE — Progress Notes (Signed)
LVM for patient that RX was called in and labs need to be done.

## 2012-11-09 NOTE — Telephone Encounter (Signed)
Please call patient.  2nd notice.  Needs to get labs done or no further refills.  2 wks supply only

## 2012-11-10 LAB — LIPID PANEL
Cholesterol, total: 167 mg/dL (ref 100–199)
HDL Cholesterol: 41 mg/dL (ref >39)
LDL, calculated: 110 mg/dL — ABNORMAL HIGH (ref 0–99)
Triglyceride: 78 mg/dL (ref 0–149)
VLDL, calculated: 16 mg/dL (ref 5–40)

## 2012-11-10 LAB — METABOLIC PANEL, COMPREHENSIVE
A-G Ratio: 1.4 (ref 1.1–2.5)
ALT (SGPT): 16 IU/L (ref 0–32)
AST (SGOT): 13 IU/L (ref 0–40)
Albumin: 3.9 g/dL (ref 3.5–5.5)
Alk. phosphatase: 103 IU/L (ref 39–117)
BUN/Creatinine ratio: 15 (ref 9–23)
BUN: 13 mg/dL (ref 6–24)
Bilirubin, total: 0.4 mg/dL (ref 0.0–1.2)
CO2: 24 mmol/L (ref 18–29)
Calcium: 9.6 mg/dL (ref 8.7–10.2)
Chloride: 105 mmol/L (ref 97–108)
Creatinine: 0.87 mg/dL (ref 0.57–1.00)
GFR est AA: 87 mL/min/{1.73_m2} (ref >59)
GFR est non-AA: 75 mL/min/{1.73_m2} (ref >59)
GLOBULIN, TOTAL: 2.8 g/dL (ref 1.5–4.5)
Glucose: 101 mg/dL — ABNORMAL HIGH (ref 65–99)
Potassium: 4.8 mmol/L (ref 3.5–5.2)
Protein, total: 6.7 g/dL (ref 6.0–8.5)
Sodium: 141 mmol/L (ref 134–144)

## 2012-11-10 LAB — CVD REPORT: PDF IMAGE: 0

## 2012-11-10 NOTE — Progress Notes (Signed)
Quick Note:    Letter sent to patient. All labs are stable or at goal, except just barely high BS (101). Life style changes  ______

## 2012-11-10 NOTE — Telephone Encounter (Signed)
Labs resulted, appt 8/8.

## 2012-11-18 NOTE — Progress Notes (Signed)
Patient c/o headaches when she takes her cholesterol medication.

## 2012-11-18 NOTE — Progress Notes (Signed)
HISTORY OF PRESENT ILLNESS  Melody Harrison is a 57 y.o. female.  HPI  Pt will not be moving to [place] at this time, maybe next year.    Cardiovascular Review  The patient has hypertension and hyperlipidemia.  She reports over the last few months noticing fatigue, muscle aches, and headache (left sided temple, throbbing).  She thought it was due to her statin so she stopped it yesterday and has not had any issues since then.  This is occuring "a lot" but could not be more specific.  All this started 1 month ago, just prior to planning on moving to [place], and was after a viral infection (can't recall specifics).  Diet and Lifestyle: generally follows a low fat low cholesterol diet, generally follows a low sodium diet, exercises sporadically.  Lab review: labs reviewed and discussed with patient.          Prior to Admission medications    Medication Sig Start Date End Date Taking? Authorizing Provider   herbal drugs (COLON HERBAL CLEANSER) cap Take 1 Cap by mouth daily.   Yes Historical Provider   metoprolol succinate (TOPROL-XL) 25 mg XL tablet Take 1.5 Tabs by mouth daily. 08/24/12  Yes Bud Face, MD   aspirin delayed-release 81 mg tablet Take 81 mg by mouth daily.   Yes Historical Provider   multivitamin with iron tablet Take 1 Tab by mouth daily.   Yes Historical Provider   vit B Cmplx 3-FA-Vit C-Biotin (NEPHRO VITE RX) 1-60-300 mg-mg-mcg tablet Take 1 Tab by mouth daily.   Yes Historical Provider   omega-3 fatty acids-vitamin e (FISH OIL) 1,000 mg cap Take 1 Cap by mouth daily.   Yes Historical Provider   pravastatin (PRAVACHOL) 20 mg tablet TAKE 1 TABLET BY MOUTH ONCE EVERY EVENING 11/05/12   Bud Face, MD          No Known Allergies         Review of Systems   Constitutional: Positive for malaise/fatigue. Negative for weight loss.   HENT: Negative for ear pain and congestion.    Respiratory: Negative for cough and shortness of breath.    Cardiovascular: Negative for chest pain and palpitations.    Gastrointestinal: Negative for nausea, vomiting, abdominal pain, diarrhea, constipation, blood in stool and melena.   Genitourinary: Negative for dysuria, urgency, frequency and hematuria.   Musculoskeletal: Positive for myalgias. Negative for joint pain.   Neurological: Positive for headaches.       Physical Exam   Constitutional: No distress.   HENT:   Head:       Left Ear: Tympanic membrane is not erythematous and not bulging.  No middle ear effusion.   Nose: No mucosal edema or rhinorrhea. Right sinus exhibits no maxillary sinus tenderness and no frontal sinus tenderness. Left sinus exhibits no maxillary sinus tenderness and no frontal sinus tenderness.   Mouth/Throat: Uvula is midline and mucous membranes are normal. No oropharyngeal exudate or posterior oropharyngeal erythema.   Right ear is 75 % occluded with hard cerumen.     Eyes: Conjunctivae are normal. No scleral icterus.   Neck: Neck supple.   Cardiovascular: Regular rhythm and normal heart sounds.    No murmur heard.  Pulmonary/Chest: Effort normal and breath sounds normal. She has no wheezes. She has no rales.   Abdominal: Bowel sounds are normal. She exhibits no mass. There is no hepatosplenomegaly. There is no tenderness.   Musculoskeletal:        Right lower leg:  She exhibits tenderness. She exhibits no swelling.        Left lower leg: She exhibits tenderness. She exhibits no swelling.   Lymphadenopathy:     She has no cervical adenopathy.   Psychiatric: She has a normal mood and affect. Her behavior is normal.       BP 98/66   Pulse 77   Ht 5\' 10"  (1.778 m)   Wt 214 lb 9.6 oz (97.342 kg)   BMI 30.79 kg/m2   SpO2 99%   LMP 03/28/2012      ASSESSMENT and PLAN  Melody Harrison was seen today for headache.  Diagnoses and associated orders for this visit:    Headache  - CBC W/O DIFF  - URINALYSIS W/ RFLX MICROSCOPIC  - SED RATE (ESR)  - METABOLIC PANEL, COMPREHENSIVE    Myalgia  - CBC W/O DIFF  - URINALYSIS W/ RFLX MICROSCOPIC  - SED RATE (ESR)  - METABOLIC  PANEL, COMPREHENSIVE      Both of these are new dx, likely related but not entirely clear.  Would be atypical to start after infection & be due to statin, but will cont to hold medication at this time.  Could be TA, so will check ESR.  Will need to add on CK (forgot to order this).  No h/o tick bites so do not think this is tick borne at this time.  Will check labs.  Will use Tylenol prn.  Red flags were reviewed with the patient to RTC or notify me.  Expected time course for resolution reviewed with patient.     Hypotension- new dx, asymptomatic, decrease BB to 1 tab daily, see AVS        Follow-up Disposition:  Return if symptoms worsen or fail to improve.   Advised her to call back or return to office if symptoms worsen/change/persist.  Discussed expected course/resolution/complications of diagnosis in detail with patient.    Medication risks/benefits/costs/interactions/alternatives discussed with patient.  She was given an after visit summary which includes diagnoses, current medications, & vitals.  She expressed understanding with the diagnosis and plan.

## 2012-11-18 NOTE — Patient Instructions (Signed)
Stop the pravastatin for now.    Decrease the metoprolol to 1 tab a day.  Continue to check your BP daily.  If it starts going up (> 140/90) then go back to 1 1/2 tabs.    Stay hydrated and rest.  Tylenol for headache        Headache: After Your Visit  Your Care Instructions     Headaches have many possible causes. Most headaches aren't a sign of a more serious problem, and they will get better on their own. Home treatment may help you feel better faster.  The doctor has checked you carefully, but problems can develop later. If you notice any problems or new symptoms, get medical treatment right away.  Follow-up care is a key part of your treatment and safety. Be sure to make and go to all appointments, and call your doctor if you are having problems. It's also a good idea to know your test results and keep a list of the medicines you take.  How can you care for yourself at home?  ?? Do not drive if you have taken a prescription pain medicine.  ?? Rest in a quiet, dark room until your headache is gone. Close your eyes and try to relax or go to sleep. Don't watch TV or read.  ?? Put a cold, moist cloth or cold pack on the painful area for 10 to 20 minutes at a time. Put a thin cloth between the cold pack and your skin.  ?? Use a warm, moist towel or a heating pad set on low to relax tight shoulder and neck muscles.  ?? Have someone gently massage your neck and shoulders.  ?? Take pain medicines exactly as directed.  ?? If the doctor gave you a prescription medicine for pain, take it as prescribed.  ?? If you are not taking a prescription pain medicine, ask your doctor if you can take an over-the-counter medicine.  ?? Be careful not to take pain medicine more often than the instructions allow, because you may get worse or more frequent headaches when the medicine wears off.  ?? Do not ignore new symptoms that occur with a headache, such as a fever, weakness or numbness, vision changes, or confusion. These may be signs of a  more serious problem.  To prevent headaches  ?? Keep a headache diary so you can figure out what triggers your headaches. Avoiding triggers may help you prevent headaches. Record when each headache began, how long it lasted, and what the pain was like (throbbing, aching, stabbing, or dull). Write down any other symptoms you had with the headache, such as nausea, flashing lights or dark spots, or sensitivity to bright light or loud noise. Note if the headache occurred near your period. List anything that might have triggered the headache, such as certain foods (chocolate, cheese, wine) or odors, smoke, bright light, stress, or lack of sleep.  ?? Find healthy ways to deal with stress. Headaches are most common during or right after stressful times. Take time to relax before and after you do something that has caused a headache in the past.  ?? Try to keep your muscles relaxed by keeping good posture. Check your jaw, face, neck, and shoulder muscles for tension, and try relaxing them. When sitting at a desk, change positions often, and stretch for 30 seconds each hour.  ?? Get plenty of sleep and exercise.  ?? Eat regularly and well. Long periods without food can trigger a headache.  ??  Treat yourself to a massage. Some people find that regular massages are very helpful in relieving tension.  ?? Limit caffeine by not drinking too much coffee, tea, or soda. But don't quit caffeine suddenly, because that can also give you headaches.  ?? Reduce eyestrain from computers by blinking frequently and looking away from the computer screen every so often. Make sure you have proper eyewear and that your monitor is set up properly, about an arm's length away.  ?? Seek help if you have depression or anxiety. Your headaches may be linked to these conditions. Treatment can both prevent headaches and help with symptoms of anxiety or depression.  When should you call for help?  Call 911 anytime you think you may need emergency care. For  example, call if:  ?? You have signs of a stroke. These may include:  ?? Sudden numbness, paralysis, or weakness in your face, arm, or leg, especially on only one side of your body.  ?? Sudden vision changes.  ?? Sudden trouble speaking.  ?? Sudden confusion or trouble understanding simple statements.  ?? Sudden problems with walking or balance.  ?? A sudden, severe headache that is different from past headaches.  Call your doctor now or seek immediate medical care if:  ?? You have a new or worse headache.  ?? Your headache gets much worse.   Where can you learn more?   Go to [employer]  Enter M271 in the search box to learn more about "Headache: After Your Visit."   ?? 2006-2014 Healthwise, Incorporated. Care instructions adapted under license by [employer] (which disclaims liability or warranty for this information). This care instruction is for use with your licensed healthcare professional. If you have questions about a medical condition or this instruction, always ask your healthcare professional. Healthwise, Incorporated disclaims any warranty or liability for your use of this information.  Content Version: 10.1.311062; Current as of: June 24, 2012

## 2012-11-19 LAB — URINALYSIS W/ RFLX MICROSCOPIC
Bilirubin: NEGATIVE
Blood: NEGATIVE
Glucose: NEGATIVE
Ketone: NEGATIVE
Leukocyte Esterase: NEGATIVE
Nitrites: NEGATIVE
Protein: NEGATIVE
Specific Gravity: 1.022 (ref 1.005–1.030)
Urobilinogen: 0.2 mg/dL (ref 0.0–1.9)
pH (UA): 5.5 (ref 5.0–7.5)

## 2012-11-19 LAB — CBC W/O DIFF
HCT: 39.2 % (ref 34.0–46.6)
HGB: 12.5 g/dL (ref 11.1–15.9)
MCH: 28.8 pg (ref 26.6–33.0)
MCHC: 31.9 g/dL (ref 31.5–35.7)
MCV: 90 fL (ref 79–97)
PLATELET: 228 10*3/uL (ref 150–379)
RBC: 4.34 x10E6/uL (ref 3.77–5.28)
RDW: 13.8 % (ref 12.3–15.4)
WBC: 6.9 10*3/uL (ref 3.4–10.8)

## 2012-11-19 LAB — METABOLIC PANEL, COMPREHENSIVE
A-G Ratio: 1.4 (ref 1.1–2.5)
ALT (SGPT): 13 IU/L (ref 0–32)
AST (SGOT): 16 IU/L (ref 0–40)
Albumin: 4 g/dL (ref 3.5–5.5)
Alk. phosphatase: 101 IU/L (ref 39–117)
BUN/Creatinine ratio: 14 (ref 9–23)
BUN: 14 mg/dL (ref 6–24)
Bilirubin, total: 0.3 mg/dL (ref 0.0–1.2)
CO2: 23 mmol/L (ref 18–29)
Calcium: 9.3 mg/dL (ref 8.7–10.2)
Chloride: 105 mmol/L (ref 97–108)
Creatinine: 0.99 mg/dL (ref 0.57–1.00)
GFR est AA: 74 mL/min/{1.73_m2} (ref >59)
GFR est non-AA: 64 mL/min/{1.73_m2} (ref >59)
GLOBULIN, TOTAL: 2.8 g/dL (ref 1.5–4.5)
Glucose: 73 mg/dL (ref 65–99)
Potassium: 4.3 mmol/L (ref 3.5–5.2)
Protein, total: 6.8 g/dL (ref 6.0–8.5)
Sodium: 143 mmol/L (ref 134–144)

## 2012-11-19 LAB — SED RATE (ESR): Sed rate (ESR): 16 mm/hr (ref 0–40)

## 2012-11-19 NOTE — Progress Notes (Signed)
Quick Note:    Please call [facility] and have them add on CK. Dx: myalgias.  Please call patient. All labs are normal, nothing abnormal. Pending the lab above this could be viral but not sure. Would check a CXR if CK is normal and she is still not feeling good  ______

## 2012-11-20 NOTE — Progress Notes (Signed)
Quick Note:    S/W Fayrene Fearing @ [facility], added CK to recent blood work, per [facility] order. Advised pt per Dr.Hayes' note regarding recent blood work. Patient verbalized understanding.    ______

## 2012-11-22 LAB — SPECIMEN STATUS REPORT

## 2012-11-23 LAB — SPECIMEN STATUS REPORT

## 2012-11-23 LAB — CK: Creatine Kinase,Total: 677 U/L — CR (ref 24–173)

## 2012-11-23 NOTE — Progress Notes (Signed)
Quick Note:    Please call patient. CK enzyme is elevated indicating muscle break down. Needs to stop statin (she should have already). Find out how she is doing. Will like to repeat labs in 1 week to verify it is improving. Needs to stay hydrated. (order CK: dx- rhabdomyolysis)  ______

## 2012-11-24 NOTE — Progress Notes (Signed)
Quick Note:    LMTCB  ______

## 2012-11-25 ENCOUNTER — Encounter

## 2012-11-25 NOTE — Progress Notes (Signed)
Quick Note:    Advised pt per Dr.Hayes' note regarding recent blood work. Patient verbalized understanding, stated she is feeling "okay, had alittle abd pain last night." Advised lab slip at the front desk for her to p/u and have CK repeated in 1 week.  ______

## 2012-12-10 LAB — CK: Creatine Kinase,Total: 193 U/L — ABNORMAL HIGH (ref 24–173)

## 2012-12-10 NOTE — Progress Notes (Signed)
Quick Note:    Please call patient. CK improved dramatically and just above goal. Unclear if due to statin or viral infection, but favor viral infection. Will cont to hold statin for next month. Would restart it then and will need labs in 3 months after restarting meds  ______

## 2012-12-10 NOTE — Progress Notes (Signed)
Quick Note:    Advised pt per Dr.Hayes' note regarding recent blood work. Patient verbalized understanding.  ______

## 2013-04-14 NOTE — Telephone Encounter (Signed)
Assisted pt in scheduling an appt with Dr.Hayes for 04/20/2013 @ 4:45.

## 2013-04-14 NOTE — Telephone Encounter (Signed)
Message copied by Kathleen Argue on Wed Apr 14, 2013 10:19 AM  ------       Message from: Julieta Bellini P       Created: Wed Apr 14, 2013  7:41 AM         Please call patient.  Last seen in 8/14.  meds adjusted for BP, due for f/u.  ------

## 2013-04-20 NOTE — Progress Notes (Signed)
HISTORY OF PRESENT ILLNESS  Melody Harrison is a 58 y.o. female.  HPI  Chronic Pain  Patient has myalgias, primarily affecting the calfs (bilateral).  Since last visit this is the only pain that is remaining.  She reports sharp & cramping, located in both calves, no radiation, she thinks this could be related to working on concrete.  She has left 4th toe pain.  She had stitches placed on the base of this toe years ago and it is starting to bother her again.  She reports left lower back is pain at times.  Both hands are cramping up at times.  She has tried an OTC "red alcohol" with some relief.  She has been off the cholesterol meds for several months.    Cardiovascular Review  The patient has hypertension and hyperlipidemia.  She reports taking medications as instructed, except for statin (stopped due to muscle pain/cramps).  Home BP Monitoring: is well controlled at home, ranging 120's/80's. Diet and Lifestyle: generally follows a low fat low cholesterol diet, generally follows a low sodium diet, exercises sporadically, but active at work daily.  Lab review: labs reviewed and discussed with patient.      Pulmonary Review  The patient is being seen for tobacco abuse.  She admits to smoking 4-5 cig/day.  She does desire to quit.  Current limitations in activity: none.  Coughing when present is described as none.  She has never tried smoking besides cold [place].  She is only smoking at work.         Prior to Admission medications    Medication Sig Start Date End Date Taking? Authorizing Provider   MULTIVITAMINS-MIN/FA/GINKGO (WOMEN'S 50+ DAILY FORMULA PO) Take 1 tablet by mouth daily.   Yes Historical Provider   metoprolol succinate (TOPROL-XL) 25 mg XL tablet Take 1.5 Tabs by mouth daily. 08/24/12  Yes Bud Facehristopher P Tomma Ehinger, MD   aspirin delayed-release 81 mg tablet Take 81 mg by mouth daily.   Yes Historical Provider   pravastatin (PRAVACHOL) 20 mg tablet TAKE 1 TABLET BY MOUTH ONCE EVERY EVENING 11/05/12    Bud Facehristopher P Joshus Rogan, MD          No Known Allergies         Review of Systems   Constitutional: Negative for weight loss and malaise/fatigue.   Eyes: Negative for blurred vision.   Respiratory: Negative for cough and shortness of breath.    Cardiovascular: Negative for chest pain, palpitations and leg swelling.   Gastrointestinal: Negative for heartburn, nausea, vomiting, abdominal pain, diarrhea and constipation.   Genitourinary: Negative for frequency.   Musculoskeletal: Positive for myalgias and back pain. Negative for joint pain.   Skin: Negative for rash.   Neurological: Negative for tingling, sensory change, focal weakness and headaches.   Endo/Heme/Allergies: Does not bruise/bleed easily.   Psychiatric/Behavioral: Negative for depression. The patient is not nervous/anxious and does not have insomnia.        Physical Exam   Constitutional: No distress.   Cardiovascular: Regular rhythm and normal heart sounds.    No murmur heard.  Pulmonary/Chest: Effort normal and breath sounds normal. She has no wheezes. She has no rales.   Abdominal: Bowel sounds are normal. She exhibits no mass. There is no hepatosplenomegaly. There is no tenderness.   Musculoskeletal:        Lumbar back: She exhibits no tenderness, no bony tenderness, no pain and no spasm.   Neurological:   Straight leg raise was Negative in bilateral LE. Strength  is 5/5 in lower extremities. Sensation is intact to light touch in lower extremities.        BP 140/82   Pulse 81   Temp(Src) 98.6 ??F (37 ??C) (Oral)   Resp 16   Ht 5\' 10"  (1.778 m)   Wt 219 lb (99.338 kg)   BMI 31.42 kg/m2   SpO2 96%   LMP 03/28/2012      ASSESSMENT and PLAN  Melody Harrison was seen today for hypertension.  Diagnoses and associated orders for this visit:    Essential hypertension, benign- borderline controlled, continue current treatment.   - metoprolol succinate (TOPROL-XL) 25 mg XL tablet; Take 1 tablet by mouth daily.    Hyperlipidemia- unknown control, will repeat labs off of meds  to get baseline and then depending on results need to try a different statin due to mylagias from pravastatin.   - LIPID PANEL    Obesity- Discussed the patient's above normal BMI with her.  The BMI follow up plan is as follows: BMI is out of normal parameters and plan is as follows: I have counseled this patient on diet and exercise regimens      Bilateral calf pain- sounds muscular, likely related to job, will check CK though to r/o muscle damage, reviewed good soled shoes & other life style changes while at work  - CK        She will RTC or go to his local pharmacy to get the flu shot. She was informed to notify me if she gets it at their local pharmacy.      Follow-up Disposition:  Return in about 6 months (around 10/18/2013).   Advised her to call back or return to office if symptoms worsen/change/persist.  Discussed expected course/resolution/complications of diagnosis in detail with patient.    Medication risks/benefits/costs/interactions/alternatives discussed with patient.  She was given an after visit summary which includes diagnoses, current medications, & vitals.  She expressed understanding with the diagnosis and plan.

## 2013-04-20 NOTE — Patient Instructions (Signed)
WEIGHT LOSS RECOMMENDATIONS:    [facility]:               - [address]  [place], [place]   - [phone]   - [email or web address]   - 3 month initial course   - Initial fee + monthly membership fee    [facility]   - Dr Jeffrey Sicat   - [address]  [place], [place]   - [phone]   - [email or web address]   - Cost: $135 initial visit, $65 follow up visits    Weight Watchers:   - See website    Jenny Craig:   - See website    New Technology:   - My Fitness Pal or other Apps for your phone   - FitBit or FuelBand    Medications:   - Phentermine    - Qsymia   - Belviq      Aerobic exercise: goal of 3-5 times per week, about 30 minutes    Diet changes: limiting daily calorie intake to 2,000.  Work on reading nutrition labels on food (in particular the serving size, the calories per serving, and carbohydrates).    Starting a Weight Loss Plan: After Your Visit  Your Care Instructions  If you are thinking about losing weight, it can be hard to know where to start. Your doctor can help you set up a weight loss plan that best meets your needs. You may want to take a class on nutrition or exercise, or join a weight loss support group. If you have questions about how to make changes to your eating or exercise habits, ask your doctor about seeing a registered dietitian or an exercise specialist.  It can be a big challenge to lose weight. But you do not have to make huge changes at once. Make small changes, and stick with them. When those changes become habit, add a few more changes.  If you do not think you are ready to make changes right now, try to pick a date in the future. Make an appointment to see your doctor to discuss whether the time is right for you to start a plan.  Follow-up care is a key part of your treatment and safety. Be sure to make and go to all appointments, and call your doctor if you are having problems. It???s also a good idea to know your test results and keep a list of  the medicines you take.  How can you care for yourself at home?  ?? Set realistic goals. Many people expect to lose much more weight than is likely. A weight loss of 5% to 10% of your body weight may be enough to improve your health.  ?? Get family and friends involved to provide support. Talk to them about why you are trying to lose weight, and ask them to help. They can help by participating in exercise and having meals with you, even if they may be eating something different.  ?? Find what works best for you. If you do not have time or do not like to cook, a program that offers meal replacement bars or shakes may be better for you. Or if you like to prepare meals, finding a plan that includes daily menus and recipes may be best.  ?? Ask your doctor about other health professionals who can help you achieve your weight loss goals.  ?? A dietitian can help you make healthy changes in your diet.  ?? An   exercise specialist or personal trainer can help you develop a safe and effective exercise program.  ?? A counselor or psychiatrist can help you cope with issues such as depression, anxiety, or family problems that can make it hard to focus on weight loss.  ?? Consider joining a support group for people who are trying to lose weight. Your doctor can suggest groups in your area.   Where can you learn more?   Go to http://www.healthwise.net/[facility]  Enter U357 in the search box to learn more about "Starting a Weight Loss Plan: After Your Visit."   ?? 2006-2014 Healthwise, Incorporated. Care instructions adapted under license by [employer] (which disclaims liability or warranty for this information). This care instruction is for use with your licensed healthcare professional. If you have questions about a medical condition or this instruction, always ask your healthcare professional. Healthwise, Incorporated disclaims any warranty or liability for your use of this information.  Content Version: 10.0.270728; Last Revised:  November 19, 2011

## 2013-04-20 NOTE — Progress Notes (Signed)
Follow up for HTN.

## 2013-06-18 LAB — LIPID PANEL
Cholesterol, total: 217 mg/dL — ABNORMAL HIGH (ref 100–199)
HDL Cholesterol: 40 mg/dL (ref >39)
LDL, calculated: 150 mg/dL — ABNORMAL HIGH (ref 0–99)
Triglyceride: 133 mg/dL (ref 0–149)
VLDL, calculated: 27 mg/dL (ref 5–40)

## 2013-06-18 LAB — CK: Creatine Kinase,Total: 367 U/L — ABNORMAL HIGH (ref 24–173)

## 2013-06-18 NOTE — Progress Notes (Signed)
Quick Note:    Please call patient. Lipids worse off meds, needs to be on statin. Please inquire how she is feeling (aches/pains). Would recommend lovastatin 20mg , 1 tab PO daily, #30, RF o  ______

## 2013-06-21 MED ORDER — LOVASTATIN 20 MG TAB
20 mg | ORAL_TABLET | ORAL | Status: DC
Start: 2013-06-21 — End: 2013-10-27

## 2013-06-21 MED ORDER — LOVASTATIN 20 MG TAB
20 mg | ORAL_TABLET | Freq: Every evening | ORAL | Status: DC
Start: 2013-06-21 — End: 2013-06-21

## 2013-06-21 NOTE — Progress Notes (Signed)
Quick Note:    LMTCB  ______

## 2013-06-21 NOTE — Telephone Encounter (Signed)
Verbal order by Dr.Hayes for refill.

## 2013-06-21 NOTE — Progress Notes (Signed)
Quick Note:    Advised pt per Dr.Hayes' note regarding recent blood work. Patient verbalized understanding, agreed to starting Lovastatin. Stated she is "still feeling achy." [facility]  ______

## 2013-06-23 NOTE — Progress Notes (Signed)
Quick Note:    Attempted to reach pt via phone, unsuccessful. Left voicemail message for pt to return call.  ______

## 2013-06-29 NOTE — Progress Notes (Signed)
Quick Note:    Attempted to reach pt via phone, unsuccessful. Left voicemail message for pt to return call.  ______

## 2013-06-29 NOTE — Progress Notes (Signed)
Quick Note:    Advised pt per Dr.Hayes' recommendation for her to be seen by Rheumatologist. Patient verbalized understanding, gave pt Dr.Stern/Patel's office number to contact for appt.  ______

## 2013-10-22 NOTE — Progress Notes (Signed)
HPI:  Melody Harrison is a 58 y.o. year old female who returns to clinic today for routine follow up.    Cardiovascular Review  The patient has hypertension, hyperlipidemia and obesity.  She reports taking medications as instructed, no medication side effects noted (not having any more muscle cramps/pains), patient does not perform home BP monitoring.  Diet and Lifestyle: generally follows a low fat low cholesterol diet, generally follows a low sodium diet, sedentary.  Lab review: labs reviewed and discussed with patient.      URI Review  Melody BarmanChristine Harrison is a 58 y.o. female who is here to talk about: dry cough for 5 days ago, unchanged since that time.  She denies a history of congestion, post nasal drip, headache, fever, chills, sinus congestion and SOB/DOE.  Evaluation to date: none.  Treatment to date: none.  Relevant PMH: No pertinent additional PMH.  Patient reports sick contacts: no.      Pulmonary Review  The patient is being seen for tobacco use.  She admits to smoking over the 4th of July holiday.  Previously had not smoked in 3 months and has not smoked since.  Her plan is to continue not to smoke.          Prior to Admission medications    Medication Sig Start Date End Date Taking? Authorizing Provider   omega-3 fatty acids-vitamin e (FISH OIL) 1,000 mg cap Take 1 Cap by mouth daily.   Yes Historical Provider   lovastatin (MEVACOR) 20 mg tablet TAKE 1 TABLET BY MOUTH NIGHTLY 06/21/13  Yes Bud Facehristopher P Daviona Herbert, MD   MULTIVITAMINS-MIN/FA/GINKGO (WOMEN'S 50+ DAILY FORMULA PO) Take 1 tablet by mouth daily.   Yes Historical Provider   metoprolol succinate (TOPROL-XL) 25 mg XL tablet Take 1 tablet by mouth daily. 04/20/13  Yes Bud Facehristopher P Jahmir Salo, MD   aspirin delayed-release 81 mg tablet Take 81 mg by mouth daily.   Yes Historical Provider          No Known Allergies        Review of Systems   HENT: Negative for congestion.    Respiratory: Positive for cough. Negative for shortness of breath.     Cardiovascular: Negative for chest pain and palpitations.   Gastrointestinal: Negative for nausea, vomiting, abdominal pain, diarrhea and constipation.   Musculoskeletal: Negative for myalgias.   Psychiatric/Behavioral: Negative for depression. The patient is not nervous/anxious and does not have insomnia.          Physical Exam   Constitutional: No distress.   Eyes: Conjunctivae are normal. No scleral icterus.   Neck: Neck supple.   Cardiovascular: Regular rhythm and normal heart sounds.    No murmur heard.  Pulmonary/Chest: Effort normal and breath sounds normal. She has no wheezes. She has no rales.   Abdominal: Bowel sounds are normal. She exhibits no mass. There is no hepatosplenomegaly. There is no tenderness.   Musculoskeletal: She exhibits no edema.   Lymphadenopathy:     She has no cervical adenopathy.   Skin:   Skin graft on L hand.  Center chest there is a firm skin tag, no erythema   Psychiatric: She has a normal mood and affect. Her behavior is normal.         BP 108/76 mmHg   Pulse 67   Temp(Src) 98.4 ??F (36.9 ??C) (Oral)   Resp 20   Ht 5\' 10"  (1.778 m)   Wt 217 lb 3.2 oz (98.521 kg)   BMI 31.16 kg/m2  SpO2 98%   LMP 03/28/2012      Assessment & Plan:  Donni was seen today for hypertension.  Diagnoses and associated orders for this visit:    Essential hypertension, benign- well controlled, continue current treatment pending review of labs.   - METABOLIC PANEL, COMPREHENSIVE    Hyperlipidemia- improved w/ med changes, will check CK, never saw rheumatologist, will need refills  - METABOLIC PANEL, COMPREHENSIVE  - LIPID PANEL  - CK    Obesity- unchanged, discussed the patient's above normal BMI with her.  The follow up plan is as follows: I have counseled this patient on diet and exercise regimens, focus on diet changes    Cough- new dx, favor viral, mucinex but no other changes.  Red flags were reviewed with the patient to RTC or notify me.  Expected time course for  resolution reviewed with patient.     Need for hepatitis C screening test  - HEPATITIS C AB    Colon cancer screening- overdue  - REFERRAL FOR COLONOSCOPY      List of dermatologists provided         Follow-up Disposition:  Return in about 6 months (around 04/24/2014) for FULL PHYSICAL - 30 minutes.   Advised her to call back or return to office if symptoms worsen/change/persist.  Discussed expected course/resolution/complications of diagnosis in detail with patient.    Medication risks/benefits/costs/interactions/alternatives discussed with patient.  She was given an after visit summary which includes diagnoses, current medications, & vitals.  She expressed understanding with the diagnosis and plan.

## 2013-10-22 NOTE — Progress Notes (Signed)
Follow up for HTN; cholesterol.

## 2013-10-23 LAB — LIPID PANEL
Cholesterol, total: 154 mg/dL (ref 100–199)
HDL Cholesterol: 38 mg/dL — ABNORMAL LOW (ref >39)
LDL, calculated: 94 mg/dL (ref 0–99)
Triglyceride: 108 mg/dL (ref 0–149)
VLDL, calculated: 22 mg/dL (ref 5–40)

## 2013-10-23 LAB — METABOLIC PANEL, COMPREHENSIVE
A-G Ratio: 1.3 (ref 1.1–2.5)
ALT (SGPT): 14 IU/L (ref 0–32)
AST (SGOT): 14 IU/L (ref 0–40)
Albumin: 3.9 g/dL (ref 3.5–5.5)
Alk. phosphatase: 103 IU/L (ref 39–117)
BUN/Creatinine ratio: 11 (ref 9–23)
BUN: 10 mg/dL (ref 6–24)
Bilirubin, total: 0.4 mg/dL (ref 0.0–1.2)
CO2: 22 mmol/L (ref 18–29)
Calcium: 9.1 mg/dL (ref 8.7–10.2)
Chloride: 104 mmol/L (ref 97–108)
Creatinine: 0.92 mg/dL (ref 0.57–1.00)
GFR est AA: 80 mL/min/{1.73_m2} (ref >59)
GFR est non-AA: 70 mL/min/{1.73_m2} (ref >59)
GLOBULIN, TOTAL: 3 g/dL (ref 1.5–4.5)
Glucose: 89 mg/dL (ref 65–99)
Potassium: 4.5 mmol/L (ref 3.5–5.2)
Protein, total: 6.9 g/dL (ref 6.0–8.5)
Sodium: 143 mmol/L (ref 134–144)

## 2013-10-23 LAB — HEPATITIS C AB: HEP C VIRUS AB: <0.1 s/co ratio (ref 0.0–0.9)

## 2013-10-23 LAB — CK: Creatine Kinase,Total: 188 U/L — ABNORMAL HIGH (ref 24–173)

## 2013-10-23 LAB — HEPATITIS C ANTIBODY: HCV Ab: <0.1 s/co ratio (ref 0.0–0.9)

## 2013-10-24 NOTE — Progress Notes (Signed)
Quick Note:        Letter sent to patient. All labs are stable or at goal. Lipids are improved. CK also improved (just above goal). No changes.    ______

## 2013-10-27 NOTE — Progress Notes (Signed)
Order/referral, Scheduling Form, and demographic information faxed to Dr. Ronnell Freshwater office [facility]) as per Dr. Madilyn Fireman.  They will call the patient to schedule appointment.

## 2013-10-28 MED ORDER — LOVASTATIN 20 MG TAB
20 mg | ORAL_TABLET | ORAL | Status: DC
Start: 2013-10-28 — End: 2014-04-22

## 2014-04-22 ENCOUNTER — Ambulatory Visit: Admit: 2014-04-22 | Discharge: 2014-04-22 | Payer: PRIVATE HEALTH INSURANCE | Attending: Internal Medicine

## 2014-04-22 DIAGNOSIS — I10 Essential (primary) hypertension: Secondary | ICD-10-CM

## 2014-04-22 MED ORDER — LOVASTATIN 20 MG TAB
20 mg | ORAL_TABLET | ORAL | Status: DC
Start: 2014-04-22 — End: 2014-10-09

## 2014-04-22 MED ORDER — METOPROLOL SUCCINATE SR 25 MG 24 HR TAB
25 mg | ORAL_TABLET | Freq: Every day | ORAL | Status: DC
Start: 2014-04-22 — End: 2015-02-24

## 2014-04-22 NOTE — Patient Instructions (Signed)
Shoulder Stretches: Exercises  Your Care Instructions  Here are some examples of exercises for your shoulder. Start each exercise slowly. Ease off the exercise if you start to have pain.  Your doctor or physical therapist will tell you when you can start these exercises and which ones will work best for you.  How to do the exercises  Note: These exercises should cause you to feel a gentle stretch, but no pain.  Shoulder stretch    1. Stand in a doorway and place one arm against the door frame. Your elbow should be a little higher than your shoulder.  2. Relax your shoulders as you lean forward, allowing your chest and shoulder muscles to stretch. You can also turn your body slightly away from your arm to stretch the muscles even more.  3. Hold for 15 to 30 seconds.  4. Repeat 2 to 4 times with each arm.  Shoulder and chest stretch    Shoulder and chest stretch   1. While sitting, relax your upper body so you slump slightly in your chair.  2. As you breathe in, straighten your back and open your arms out to the sides.  3. Gently pull your shoulder blades back and downward.  4. Hold for 15 to 30 seconds as your breathe normally.  5. Repeat 2 to 4 times.  Overhead stretch    1. Reach up over your head with both arms.  2. Hold for 15 to 30 seconds.  3. Repeat 2 to 4 times.  Follow-up care is a key part of your treatment and safety. Be sure to make and go to all appointments, and call your doctor if you are having problems. It's also a good idea to know your test results and keep a list of the medicines you take.   Where can you learn more?   Go to http://www.healthwise.net/[facility]  Enter S254 in the search box to learn more about "Shoulder Stretches: Exercises."   ?? 2006-2015 Healthwise, Incorporated. Care instructions adapted under license by [place] (which disclaims liability or warranty for this information). This care instruction is for use with your licensed  healthcare professional. If you have questions about a medical condition or this instruction, always ask your healthcare professional. Healthwise, Incorporated disclaims any warranty or liability for your use of this information.  Content Version: 10.7.482551; Current as of: Sep 03, 2013

## 2014-04-22 NOTE — Progress Notes (Signed)
1. Have you been to the ER, urgent care clinic since your last visit?  Hospitalized since your last visit? No    2. Have you seen or consulted any other health care providers outside of the [facility] since your last visit?  Include any pap smears or colon screening. No

## 2014-04-22 NOTE — Progress Notes (Signed)
HPI:  Melody Harrison is a 59 y.o. year old female who returns to clinic today for routine follow up appointment to discuss the issues below:    Pulmonary Review  The patient is being seen for tobacco use.  She admits to smoking 4 cigarettes today.  She does desire to quit.  Current limitations in activity: none.  Coughing when present is described as none.  She has bought an Radiographer, therapeutice-cig, has not started it yet.  She wants to start using it once her current pack runs out.    Cardiovascular Review  The patient has hypertension, hyperlipidemia and obesity.  She reports taking medications as instructed, no medication side effects noted, ambulatory BP monitoring in range of NORMAL at work.  Diet and Lifestyle: generally follows a low fat low cholesterol diet, generally follows a low sodium diet, sedentary, but has just joined the gym.  Lab review: labs reviewed and discussed with patient.          Prior to Admission medications    Medication Sig Start Date End Date Taking? Authorizing Provider   lovastatin (MEVACOR) 20 mg tablet TAKE ONE TABLET BY MOUTH ONCE NIGHTLY 10/28/13  Yes Bud Facehristopher P Shonika Kolasinski, MD   omega-3 fatty acids-vitamin e (FISH OIL) 1,000 mg cap Take 1 Cap by mouth daily.   Yes Historical Provider   MULTIVITAMINS-MIN/FA/GINKGO (WOMEN'S 50+ DAILY FORMULA PO) Take 1 tablet by mouth daily.   Yes Historical Provider   metoprolol succinate (TOPROL-XL) 25 mg XL tablet Take 1 tablet by mouth daily. 04/20/13  Yes Bud Facehristopher P Davy Faught, MD          No Known Allergies        Review of Systems   Constitutional: Negative for weight loss and malaise/fatigue.   Respiratory: Negative for cough and shortness of breath.    Cardiovascular: Negative for chest pain, palpitations and leg swelling.   Gastrointestinal: Negative for heartburn, nausea, vomiting, abdominal pain, diarrhea and constipation.   Genitourinary: Negative for frequency.   Musculoskeletal: Positive for joint pain. Negative for myalgias.         R shoulder, fell on the road about 2 wks ago, is slowly improving, sore, limited ROM, painful at night   Neurological: Negative for tingling, sensory change, focal weakness and headaches.   Psychiatric/Behavioral: Negative for depression. The patient is not nervous/anxious and does not have insomnia.          Physical Exam   Constitutional: No distress.   obese   Cardiovascular: Regular rhythm and normal heart sounds.    No murmur heard.  Pulmonary/Chest: Effort normal and breath sounds normal. She has no wheezes. She has no rales.   Abdominal: Bowel sounds are normal. She exhibits no mass. There is no hepatosplenomegaly. There is no tenderness.   Musculoskeletal:        Right shoulder: She exhibits decreased range of motion (limted to 45 degree flexion & abduction) and tenderness (over deltoid area). She exhibits no deformity and normal strength.         BP 107/85 mmHg   Pulse 85   Temp(Src) 97 ??F (36.1 ??C) (Oral)   Resp 16   Ht 5\' 10"  (1.778 m)   Wt 221 lb 6.4 oz (100.426 kg)   BMI 31.77 kg/m2   SpO2 99%   LMP 03/28/2012      Assessment & Plan:  Melody Harrison was seen today for blood pressure check and cholesterol problem.  Diagnoses and all orders for this visit:    Essential hypertension,  benign- stable, continue current treatment    Orders:  -     metoprolol succinate (TOPROL-XL) 25 mg XL tablet; Take 1 Tab by mouth daily.    Hyperlipidemia- well controlled, continue current treatment   Orders:  -     lovastatin (MEVACOR) 20 mg tablet; TAKE ONE TABLET BY MOUTH ONCE NIGHTLY    Obesity- stable, poorly controlled, discussed the patient's above normal BMI with her.  The follow up plan is as follows: i have counseled this patient on diet and exercise regimens, see AVS     Right shoulder pain- this is a new problem, sounds muscular, will start with heat, NSAIDs, stretching.  Recommended PT.  Reviewed does not think this is RTC or articular.  See AVS, red flags were reviewed with the  patient to RTC or notify me.  Expected time course for resolution reviewed.     Visit for screening mammogram  Orders:  -     MAM MAMMO BI DX DIGTL; Future       Reviewed the need for the flu vaccine, she opted not to get the flu vaccine today        Follow-up Disposition:  Return in about 6 months (around 10/21/2014) for FULL PHYSICAL - 30 minutes.   Advised her to call back or return to office if symptoms worsen/change/persist.  Discussed expected course/resolution/complications of diagnosis in detail with patient.    Medication risks/benefits/costs/interactions/alternatives discussed with patient.  She was given an after visit summary which includes diagnoses, current medications, & vitals.  She expressed understanding with the diagnosis and plan.

## 2014-05-02 ENCOUNTER — Ambulatory Visit: Payer: BLUE CROSS/BLUE SHIELD

## 2014-05-09 ENCOUNTER — Ambulatory Visit: Payer: BLUE CROSS/BLUE SHIELD

## 2014-07-11 ENCOUNTER — Encounter: Attending: Internal Medicine

## 2014-07-11 ENCOUNTER — Ambulatory Visit: Admit: 2014-07-11 | Discharge: 2014-07-11 | Payer: PRIVATE HEALTH INSURANCE | Attending: Internal Medicine

## 2014-07-11 DIAGNOSIS — J101 Influenza due to other identified influenza virus with other respiratory manifestations: Secondary | ICD-10-CM

## 2014-07-11 NOTE — Telephone Encounter (Signed)
Spoke with patients daughter Donnel SaxonShanique Scott ([employer]) and she states that [facility] went [facility] Friday due to trouble breathing, she states that they stated that patient had the flu and also fluid around her heart. She has been taking medications prescribed but today she is have SOB worse than before, as the patient tried to talk she is trying to catch her breath. Informed her to take patient back to the ED, she verbalized understanding and will [facility].

## 2014-07-11 NOTE — Progress Notes (Signed)
HPI:  Melody Harrison is a 59 y.o. year old female who returns to clinic today for routine follow up appointment to discuss the issues below:    Hospital Follow Up  Melody Harrison is seen for follow up from recent ED visit to [facility] on 07/08/14.  We requested the notes from last encounter, lab results, imaging, and EKG.  She presented with SOB.  She reports being told she had fluid around her heart & influenza.  She is taking her medications (Tamiflu, steroids) as directed & without any side effects.  She reports since going to the ED she has continued to improve and feel better, until this morning when she felt slight worse.  She does feel better right now.  She reports having some congestion and after coughing that up this morning she is feeling better.  She denies any f/c, chest pain or pressure.  She is still having some mild SOB.    Info she provided show WBC: 3, PLT: 150 which are different from her last check in '14.        Prior to Admission medications    Medication Sig Start Date End Date Taking? Authorizing Provider   TAMIFLU 75 mg capsule  07/08/14  Yes Historical Provider   ondansetron (ZOFRAN ODT) 4 mg disintegrating tablet  07/08/14  Yes Historical Provider   methylPREDNISolone (MEDROL DOSEPACK) 4 mg tablet  07/08/14  Yes Historical Provider   ibuprofen (MOTRIN) 600 mg tablet  07/08/14  Yes Historical Provider   metoprolol succinate (TOPROL-XL) 25 mg XL tablet Take 1 Tab by mouth daily. 04/22/14  Yes Bud Face, MD   lovastatin (MEVACOR) 20 mg tablet TAKE ONE TABLET BY MOUTH ONCE NIGHTLY 04/22/14  Yes Bud Face, MD   omega-3 fatty acids-vitamin e (FISH OIL) 1,000 mg cap Take 1 Cap by mouth daily.   Yes Historical Provider   MULTIVITAMINS-MIN/FA/GINKGO (WOMEN'S 50+ DAILY FORMULA PO) Take 1 tablet by mouth daily.   Yes Historical Provider          No Known Allergies        Review of Systems   Constitutional: Positive for malaise/fatigue.    Respiratory: Positive for cough and shortness of breath.    Cardiovascular: Negative for chest pain and palpitations.   Gastrointestinal: Negative for nausea, vomiting, abdominal pain, diarrhea and constipation.         Physical Exam   Constitutional: No distress.   Eyes: Conjunctivae are normal. No scleral icterus.   Cardiovascular: Regular rhythm and normal heart sounds.    No murmur heard.  Pulmonary/Chest: Effort normal and breath sounds normal. She has no wheezes. She has no rales.   Abdominal: Bowel sounds are normal. She exhibits no mass. There is no hepatosplenomegaly. There is no tenderness.   Psychiatric: She has a normal mood and affect. Her behavior is normal.         BP 126/84 mmHg   Pulse 60   Temp(Src) 98 ??F (36.7 ??C) (Oral)   Resp 18   Ht  (1.778 m)   Wt 212 lb 6.4 oz (96.344 kg)   BMI 30.48 kg/m2   SpO2 98%   LMP 03/28/2012      Assessment & Plan:  [name] was seen today for hospital follow up.  Diagnoses and all orders for this visit:    Influenza A- new dx to me, reviewed expectations, reviewed imaging does not reveal any signs of PNA, cont current meds & reviewed duration    Non-infectious pericarditis-  new dx, unclear if this is accurate, will need to get EKG to compare to, at this time cont current meds, will get records for review, did review following up w/ cardiologist as directed       Follow-up Disposition: Not on File   Advised her to call back or return to office if symptoms worsen/change/persist.  Discussed expected course/resolution/complications of diagnosis in detail with patient.    Medication risks/benefits/costs/interactions/alternatives discussed with patient.  She was given an after visit summary which includes diagnoses, current medications, & vitals.  She expressed understanding with the diagnosis and plan.

## 2014-07-11 NOTE — Patient Instructions (Signed)
Preventing Falls: Care Instructions  Your Care Instructions  Getting around your home safely can be a challenge if you have injuries or health problems that make it easy for you to fall. Loose rugs and furniture in walkways are among the dangers for many older people who have problems walking or who have poor eyesight. People who have conditions such as arthritis, osteoporosis, or dementia also have to be careful not to fall.  You can make your home safer with a few simple measures.  Follow-up care is a key part of your treatment and safety. Be sure to make and go to all appointments, and call your doctor if you are having problems. It's also a good idea to know your test results and keep a list of the medicines you take.  How can you care for yourself at home?  Taking care of yourself  ?? You may get dizzy if you do not drink enough water. To prevent dehydration, drink plenty of fluids, enough so that your urine is light yellow or clear like water. Choose water and other caffeine-free clear liquids. If you have kidney, heart, or liver disease and have to limit fluids, talk with your doctor before you increase the amount of fluids you drink.  ?? Exercise regularly to improve your strength, muscle tone, and balance. Walk if you can. Swimming may be a good choice if you cannot walk easily.  ?? Have your vision and hearing checked each year or any time you notice a change. If you have trouble seeing and hearing, you might not be able to avoid objects and could lose your balance.  ?? Know the side effects of the medicines you take. Ask your doctor or pharmacist whether the medicines you take can affect your balance. Sleeping pills or sedatives can affect your balance.  ?? Limit the amount of alcohol you drink. Alcohol can impair your balance and other senses.  ?? Ask your doctor whether calluses or corns on your feet need to be removed. If you wear loose-fitting shoes because of calluses or corns, you  can lose your balance and fall.  ?? Talk to your doctor if you have numbness in your feet.  Preventing falls at home  ?? Remove raised doorway thresholds, throw rugs, and clutter. Repair loose carpet or raised areas in the floor.  ?? Move furniture and electrical cords to keep them out of walking paths.  ?? Use nonskid floor wax, and wipe up spills right away, especially on ceramic tile floors.  ?? If you use a walker or cane, put rubber tips on it. If you use crutches, clean the bottoms of them regularly with an abrasive pad, such as steel wool.  ?? Keep your house well lit, especially stairways, porches, and outside walkways. Use night-lights in areas such as hallways and bathrooms. Add extra light switches or use remote switches (such as switches that go on or off when you clap your hands) to make it easier to turn lights on if you have to get up during the night.  ?? Install sturdy handrails on stairways.  ?? Move items in your cabinets so that the things you use a lot are on the lower shelves (about waist level).  ?? Keep a cordless phone and a flashlight with new batteries by your bed. If possible, put a phone in each of the main rooms of your house, or carry a cell phone in case you fall and cannot reach a phone. Or, you can wear a   device around your neck or wrist. You push a button that sends a signal for help.  ?? Wear low-heeled shoes that fit well and give your feet good support. Use footwear with nonskid soles. Check the heels and soles of your shoes for wear. Repair or replace worn heels or soles.  ?? Do not wear socks without shoes on wood floors.  ?? Walk on the grass when the sidewalks are slippery. If you live in an area that gets snow and ice in the winter, sprinkle salt on slippery steps and sidewalks.  Preventing falls in the bath  ?? Install grab bars and nonskid mats inside and outside your shower or tub and near the toilet and sinks.  ?? Use shower chairs and bath benches.   ?? Use a hand-held shower head that will allow you to sit while showering.  ?? Get into a tub or shower by putting the weaker leg in first. Get out of a tub or shower with your strong side first.  ?? Repair loose toilet seats and consider installing a raised toilet seat to make getting on and off the toilet easier.  ?? Keep your bathroom door unlocked while you are in the shower.   Where can you learn more?   Go to http://www.healthwise.net/[facility]  Enter G117 in the search box to learn more about "Preventing Falls: Care Instructions."   ?? 2006-2015 Health[employer], Inc[employer]. Care instructions adapted under license by [place] (which disclaims liability or warranty for this information). This care instruction is for use with your licensed healthcare professional. If you have questions about a medical condition or this instruction, always ask your healthcare professional. Healthwise, Incorporated disclaims any warranty or liability for your use of this information.  Content Version: 10.7.482551; Current as of: February 26, 2013

## 2014-07-13 NOTE — Progress Notes (Signed)
ER note reviewed.  Looks like pericardial effusion, not pericarditis.  EKG reported as normal.

## 2014-10-10 MED ORDER — LOVASTATIN 20 MG TAB
20 mg | ORAL_TABLET | ORAL | Status: DC
Start: 2014-10-10 — End: 2015-02-24

## 2014-10-24 ENCOUNTER — Encounter: Attending: Internal Medicine

## 2015-02-24 ENCOUNTER — Ambulatory Visit: Admit: 2015-02-24 | Payer: PRIVATE HEALTH INSURANCE | Attending: Internal Medicine

## 2015-02-24 DIAGNOSIS — Z0000 Encounter for general adult medical examination without abnormal findings: Secondary | ICD-10-CM

## 2015-02-24 MED ORDER — METOPROLOL SUCCINATE SR 25 MG 24 HR TAB
25 mg | ORAL_TABLET | Freq: Every day | ORAL | 1 refills | Status: DC
Start: 2015-02-24 — End: 2015-08-21

## 2015-02-24 MED ORDER — LOVASTATIN 20 MG TAB
20 mg | ORAL_TABLET | ORAL | 1 refills | Status: DC
Start: 2015-02-24 — End: 2015-08-21

## 2015-02-24 NOTE — Patient Instructions (Addendum)
As discussed in your appointment today, [place] is an important part of planning for your healthcare future. Discussing your preferences with your family and your care team is a part of good healthcare so that we can be guided by your known values and goals.  Our office offers this service for you at your convenience.  Our Nurse Navigators and certified Respecting Choices ?? Facilitators, Lyndee Leo and Blanch Media typically schedule family appointments for this service on Wednesdays. To schedule an Advance Care Planning visit or to receive more information about this service, please call [facility] at [phone] and ask to speak directly to Verdene Lennert or [employer].     Advance Care Planning: Care Instructions  Your Care Instructions  It can be hard to live with an illness that cannot be cured. But if your health is getting worse, you may want to make decisions about end-of-life care. Planning for the end of your life does not mean that you are giving up. It is a way to make sure that your wishes are met. Clearly stating your wishes can make it easier for your loved ones. Making plans while you are still able may also ease your mind and make your final days less stressful and more meaningful.  Follow-up care is a key part of your treatment and safety. Be sure to make and go to all appointments, and call your doctor if you are having problems. It's also a good idea to know your test results and keep a list of the medicines you take.  What can you do to plan for the end of life?  ?? You can bring these issues up with your doctor. You do not need to wait until your doctor starts the conversation. You might start with "I would not be willing to live with ...." When you complete this sentence it helps your doctor understand your wishes.  ?? Talk openly and honestly with your doctor. This is the best way to understand the decisions you will need to make as your health changes.  Know that you can always change your mind.  ?? Ask your doctor about commonly used life-support measures. These include tube feedings, breathing machines, and fluids given through a vein (IV). Understanding these treatments will help you decide whether you want them.  ?? You may choose to have these life-supporting treatments for a limited time. This allows a trial period to see whether they will help you. You may also decide that you want your doctor to take only certain measures to keep you alive. It is important to spell out these conditions so that your doctor and family understand them.  ?? Talk to your doctor about how long you are likely to live. He or she may be able to give you an idea of what usually happens with your specific illness.  ?? Think about preparing papers that state your wishes. This way there will not be any confusion about what you want. You can change your instructions at any time.  Which papers should you prepare?  Advance directives are legal papers that tell doctors how you want to be cared for at the end of your life. You do not need a lawyer to write these papers. Ask your doctor or your state health department for information on how to write your advance directives. They may have the forms for each of these types of papers. Make sure your doctor has a copy of these on file, and give  a copy to a family member or close friend.  ?? Consider a do-not-resuscitate order (DNR). This order asks that no extra treatments be done if your heart stops or you stop breathing. Extra treatments may include electrical shock to restart your heart or a machine to breathe for you. If you decide to have a DNR order, ask your doctor to explain and write it. Place the order in your home where everyone can easily see it.  ?? Consider a living will. A living will explains your wishes in case you are in a coma or cannot communicate. Living wills tell doctors to use or  not use treatments that would keep you alive. You must have one or two witnesses or a notary present when you sign this form.  ?? Consider a durable power of attorney. This allows you to name a person to make decisions about your care if you are not able to. Most people ask a close friend or family member. Talk to this person about the kinds of treatments you want and those that you do not want. Make sure this person understands your wishes. If this person is not the health care agent named in your advance directive, also talk with your health care agent.  These legal papers are simple to change. Tell your doctor what you want to change, and ask him or her to make a note in your medical file. Give your family updated copies of the papers.        Well Visit, Women 50 to 65: Care Instructions  Your Care Instructions  Physical exams can help you stay healthy. Your doctor has checked your overall health and may have suggested ways to take good care of yourself. He or she also may have recommended tests. At home, you can help prevent illness with healthy eating, regular exercise, and other steps.  Follow-up care is a key part of your treatment and safety. Be sure to make and go to all appointments, and call your doctor if you are having problems. It's also a good idea to know your test results and keep a list of the medicines you take.  How can you care for yourself at home?  ?? Reach and stay at a healthy weight. This will lower your risk for many problems, such as obesity, diabetes, heart disease, and high blood pressure.  ?? Get at least 30 minutes of exercise on most days of the week. Walking is a good choice. You also may want to do other activities, such as running, swimming, cycling, or playing tennis or team sports.  ?? Do not smoke. Smoking can make health problems worse. If you need help quitting, talk to your doctor about stop-smoking programs and medicines. These can increase your chances of quitting for good.   ?? Always wear sunscreen on exposed skin. Make sure to use a broad-spectrum sunscreen that has a sun protection factor (SPF) of 30 or higher. Use it every day, even when it is cloudy.  ?? See a dentist one or two times a year for checkups and to have your teeth cleaned.  ?? Wear a seat belt in the car.  ?? Limit alcohol to 1 drink a day. Too much alcohol can cause health problems.  Follow your doctor's advice about when to have certain tests. These tests can spot problems early.  ?? Cholesterol. Your doctor will tell you how often to have this done based on your age, family history, or other things that can increase   your risk for heart attack and stroke.  ?? Blood pressure. Have your blood pressure checked during a routine doctor visit. Your doctor will tell you how often to check your blood pressure based on your age, your blood pressure results, and other factors.  ?? Mammogram. Ask your doctor how often you should have a mammogram, which is an X-ray of your breasts. A mammogram can spot breast cancer before it can be felt and when it is easiest to treat.  ?? Pap test and pelvic exam. Ask your doctor how often you should have a Pap test. You may not need to have a Pap test as often as you used to.  ?? Vision. Have your eyes checked every year or two or as often as your doctor suggests. Some experts recommend that you have yearly exams for glaucoma and other age-related eye problems starting at age 50.  ?? Hearing. Tell your doctor if you notice any change in your hearing. You can have tests to find out how well you hear.  ?? Diabetes. Ask your doctor whether you should have tests for diabetes.  ?? Colon cancer. You should begin tests for colon cancer at age 50. You may have one of several tests. Your doctor will tell you how often to have tests based on your age and risk. Risks include whether you already had a precancerous polyp removed from your colon or whether your parents,  sisters and brothers, or children have had colon cancer.  ?? Thyroid disease. Talk to your doctor about whether to have your thyroid checked as part of a regular physical exam. Women have an increased chance of a thyroid problem.  ?? Osteoporosis. You should begin tests for bone density at age 65. If you are younger than 65, ask your doctor whether you have factors that may increase your risk for this disease. You may want to have this test before age 65.  ?? Heart attack and stroke risk. At least every 4 to 6 years, you should have your risk for heart attack and stroke assessed. Your doctor uses factors such as your age, blood pressure, cholesterol, and whether you smoke or have diabetes to show what your risk for a heart attack or stroke is over the next 10 years.  When should you call for help?  Watch closely for changes in your health, and be sure to contact your doctor if you have any problems or symptoms that concern you.  Where can you learn more?  Go to http://www.healthwise.net/[employer]  Enter Y074 in the search box to learn more about "Well Visit, Women 50 to 65: Care Instructions."  ?? 2006-2016 [employer]. Care instructions adapted under license by [employer] (which disclaims liability or warranty for this information). This care instruction is for use with your licensed healthcare professional. If you have questions about a medical condition or this instruction, always ask your healthcare professional. Healthwise, Incorporated disclaims any warranty or liability for your use of this information.  Content Version: 11.0.578772; Current as of: August 10, 2014

## 2015-02-24 NOTE — Progress Notes (Signed)
Melody Harrison is a 59 y.o. female who was seen in clinic today (02/24/2015).  Today is her anniversary.    Assessment & Plan:   Melody Harrison was seen today for complete physical.  Diagnoses and all orders for this visit:    Routine physical examination  -     METABOLIC PANEL, COMPREHENSIVE  -     CBC W/O DIFF  -     LIPID PANEL  -     HEMOGLOBIN A1C WITH EAG  -     CK    Advance directive discussed with patient  Comments:  End of life planning discussed with patient.  Patient states that they do not have an advance care plan at this time.  Information has been provided in today's     Colon cancer screening- overdue  -     REFERRAL TO GASTROENTEROLOGY    Encounter for immunization- refused all, needle phobia    Breast cancer screening  -     MAM MAMMO BI SCREENING DIGTL; Future    Menopause  -     DEXA BONE DENSITY STUDY AXIAL; Future    Essential hypertension, benign- well controlled, possibly to well controlled, will start checking at home and update me in 2 wks, continue current treatment   -     metoprolol succinate (TOPROL-XL) 25 mg XL tablet; Take 1 Tab by mouth daily.    Pure hypercholesterolemia- previously well controlled, continue current treatment pending review of labs   -     lovastatin (MEVACOR) 20 mg tablet; TAKE 1 TABLET BY MOUTH NIGHTLY    Non morbid obesity due to excess calories- stable, needs improvement, discussed the patient's above normal BMI with her.  The follow up plan is as follows: I have counseled this patient on diet and exercise regimens     Tobacco use- The patient was counseled on the dangers of tobacco use, and was advised to quit and thinking about quitting.  Reviewed strategies to maximize success, including removing cigarettes and smoking materials from environment, stress management, substitution of other forms of reinforcement and support of family/friends.     Primary osteoarthritis of left hip- this is a new problem, differential dx  reviewed with the patient, sounds like OA.  Will treat with ice, NSAIDs, rest.  Red flags were reviewed with the patient to RTC or notify me, expected time course for resolution reviewed.  Will defer imaging unless things get worse.          Follow-up Disposition:  Return in about 1 year (around 02/24/2016) for FULL PHYSICAL - 30 minutes.        ------------------------------------------------------------------------------------------    Subjective:   Health Maintenance  Immunizations:    Influenza: patient declines, reviewed pros/cons to vaccination & recommended it.  She is scared of needles.  Tetanus: up to date.  .   Cancer screening:    Cervical: reviewed guidelines, due will defer to next year per pt request.  Breast: reviewed guidelines, not up to date - will order.  Reviewed SBE with her.       Patient Care Team:  Bud Face, MD as PCP - General (Internal Medicine)  Garnetta Buddy, MD (Gastroenterology)         The following sections were reviewed & updated as appropriate: PMH, PSH, FH, and SH.    Patient Active Problem List   Diagnosis Code   ??? Hyperlipidemia E78.5   ??? Essential hypertension, benign I10   ??? Obesity E66.9   ???  H/O abnormal mammogram Z87.898   ??? Advance directive discussed with patient Z71.89          Prior to Admission medications    Medication Sig Start Date End Date Taking? Authorizing Provider   lovastatin (MEVACOR) 20 mg tablet TAKE 1 TABLET BY MOUTH NIGHTLY 10/09/14  Yes Bud Facehristopher P Ida Uppal, MD   metoprolol succinate (TOPROL-XL) 25 mg XL tablet Take 1 Tab by mouth daily. 04/22/14  Yes Bud Facehristopher P Santana Edell, MD   omega-3 fatty acids-vitamin e (FISH OIL) 1,000 mg cap Take 1 Cap by mouth daily.   Yes Historical Provider   MULTIVITAMINS-MIN/FA/GINKGO (WOMEN'S 50+ DAILY FORMULA PO) Take 1 tablet by mouth daily.   Yes Historical Provider          No Known Allergies         Review of Systems   Constitutional: Negative for chills and fever.    Respiratory: Negative for cough and shortness of breath.    Cardiovascular: Positive for palpitations (only w/ smoking). Negative for chest pain.   Gastrointestinal: Negative for abdominal pain, blood in stool, constipation, diarrhea, heartburn, nausea and vomiting.   Musculoskeletal: Positive for joint pain (L hip, on/off, no trauma, has tried shoes & insoles w/o any relief). Negative for myalgias.   Neurological: Negative for tingling, focal weakness and headaches.   Endo/Heme/Allergies: Does not bruise/bleed easily.   Psychiatric/Behavioral: Negative for depression. The patient is not nervous/anxious and does not have insomnia.          Objective:   Physical Exam   Constitutional: She appears well-developed. No distress.   obese   HENT:   Right Ear: Tympanic membrane, external ear and ear canal normal.   Left Ear: Tympanic membrane, external ear and ear canal normal.   Nose: Nose normal.   Mouth/Throat: Uvula is midline, oropharynx is clear and moist and mucous membranes are normal. No posterior oropharyngeal erythema.   Eyes: Conjunctivae and lids are normal. No scleral icterus.   Neck: Neck supple. Carotid bruit is not present. No thyromegaly present.   Cardiovascular: Regular rhythm and normal heart sounds.    No murmur heard.  Pulses:       Dorsalis pedis pulses are 2+ on the right side, and 2+ on the left side.        Posterior tibial pulses are 2+ on the right side, and 2+ on the left side.   Pulmonary/Chest: Effort normal and breath sounds normal. She has no wheezes. She has no rales.   Abdominal: Soft. Bowel sounds are normal. She exhibits no mass. There is no hepatosplenomegaly. There is no tenderness.   Musculoskeletal: Normal range of motion. She exhibits no edema.        Cervical back: Normal.        Thoracic back: She exhibits no bony tenderness.        Lumbar back: Normal.   Lymphadenopathy:     She has no cervical adenopathy.    Neurological: She has normal strength. No cranial nerve deficit or sensory deficit.   Skin: No rash noted.   Psychiatric: She has a normal mood and affect. Her behavior is normal.          Visit Vitals   ??? BP 104/72   ??? Pulse 80   ??? Temp 98.8 ??F (37.1 ??C) (Oral)   ??? Resp 16   ??? Ht 5' 9.1" (1.755 m)   ??? Wt 219 lb (99.3 kg)   ??? LMP 03/28/2012   ??? SpO2 97%   ???  BMI 32.25 kg/m2          Advised her to call back or return to office if symptoms worsen/change/persist.  Discussed expected course/resolution/complications of diagnosis in detail with patient.    Medication risks/benefits/costs/interactions/alternatives discussed with patient.  She was given an after visit summary which includes diagnoses, current medications, & vitals.  She expressed understanding with the diagnosis and plan.        Bud Face, MD

## 2015-02-24 NOTE — Progress Notes (Signed)
CPE.  Last pap here 2013.     End of life planning discussed with patient.  Patient states that they do not have an advance care plan at this time.  Information has been provided in today's after visit summary with directions on how to contact our office to schedule an appointment with our nurse navigators for this service.  Patient verbalized understanding.

## 2015-03-13 ENCOUNTER — Inpatient Hospital Stay: Admit: 2015-03-13 | Payer: BLUE CROSS/BLUE SHIELD | Attending: Internal Medicine

## 2015-03-13 DIAGNOSIS — Z780 Asymptomatic menopausal state: Secondary | ICD-10-CM

## 2015-03-13 DIAGNOSIS — Z1231 Encounter for screening mammogram for malignant neoplasm of breast: Secondary | ICD-10-CM

## 2015-03-15 NOTE — Progress Notes (Signed)
Letter sent to patient.  DEXA normal.

## 2015-05-13 LAB — METABOLIC PANEL, COMPREHENSIVE
A-G Ratio: 1.2 (ref 1.1–2.5)
ALT (SGPT): 16 IU/L (ref 0–32)
AST (SGOT): 12 IU/L (ref 0–40)
Albumin: 3.8 g/dL (ref 3.5–5.5)
Alk. phosphatase: 107 IU/L (ref 39–117)
BUN/Creatinine ratio: 12 (ref 9–23)
BUN: 11 mg/dL (ref 6–24)
Bilirubin, total: 0.5 mg/dL (ref 0.0–1.2)
CO2: 25 mmol/L (ref 18–29)
Calcium: 9.4 mg/dL (ref 8.7–10.2)
Chloride: 105 mmol/L (ref 96–106)
Creatinine: 0.94 mg/dL (ref 0.57–1.00)
GFR est AA: 78 mL/min/{1.73_m2} (ref >59)
GFR est non-AA: 68 mL/min/{1.73_m2} (ref >59)
GLOBULIN, TOTAL: 3.1 g/dL (ref 1.5–4.5)
Glucose: 108 mg/dL — ABNORMAL HIGH (ref 65–99)
Potassium: 4.6 mmol/L (ref 3.5–5.2)
Protein, total: 6.9 g/dL (ref 6.0–8.5)
Sodium: 144 mmol/L (ref 134–144)

## 2015-05-13 LAB — LIPID PANEL
Cholesterol, total: 174 mg/dL (ref 100–199)
HDL Cholesterol: 39 mg/dL — ABNORMAL LOW (ref >39)
LDL, calculated: 113 mg/dL — ABNORMAL HIGH (ref 0–99)
Triglyceride: 109 mg/dL (ref 0–149)
VLDL, calculated: 22 mg/dL (ref 5–40)

## 2015-05-13 LAB — HEMOGLOBIN A1C WITH EAG
Estimated average glucose: 117 mg/dL
Hemoglobin A1c: 5.7 % — ABNORMAL HIGH (ref 4.8–5.6)

## 2015-05-13 LAB — CK: Creatine Kinase,Total: 174 U/L — ABNORMAL HIGH (ref 24–173)

## 2015-05-13 LAB — CBC W/O DIFF
HCT: 39.1 % (ref 34.0–46.6)
HGB: 12.2 g/dL (ref 11.1–15.9)
MCH: 28.2 pg (ref 26.6–33.0)
MCHC: 31.2 g/dL — ABNORMAL LOW (ref 31.5–35.7)
MCV: 91 fL (ref 79–97)
PLATELET: 217 10*3/uL (ref 150–379)
RBC: 4.32 x10E6/uL (ref 3.77–5.28)
RDW: 13.3 % (ref 12.3–15.4)
WBC: 6.3 10*3/uL (ref 3.4–10.8)

## 2015-05-15 NOTE — Progress Notes (Signed)
Letter sent to patient.  All labs are stable or at goal except for elevated BS & A1c, new dx of pre-DM.  Lipids stable but elevated CK (stable)

## 2015-08-21 ENCOUNTER — Encounter

## 2015-08-22 MED ORDER — LOVASTATIN 20 MG TAB
20 mg | ORAL_TABLET | ORAL | 1 refills | Status: DC
Start: 2015-08-22 — End: 2015-10-25

## 2015-08-22 MED ORDER — METOPROLOL SUCCINATE SR 25 MG 24 HR TAB
25 mg | ORAL_TABLET | Freq: Every day | ORAL | 1 refills | Status: DC
Start: 2015-08-22 — End: 2015-10-25

## 2015-10-25 ENCOUNTER — Encounter: Attending: Internal Medicine

## 2015-10-25 ENCOUNTER — Ambulatory Visit: Admit: 2015-10-25 | Discharge: 2015-10-25 | Payer: PRIVATE HEALTH INSURANCE | Attending: Internal Medicine

## 2015-10-25 DIAGNOSIS — R7303 Prediabetes: Secondary | ICD-10-CM

## 2015-10-25 MED ORDER — LOVASTATIN 20 MG TAB
20 mg | ORAL_TABLET | ORAL | 1 refills | Status: AC
Start: 2015-10-25 — End: ?

## 2015-10-25 MED ORDER — METOPROLOL SUCCINATE SR 25 MG 24 HR TAB
25 mg | ORAL_TABLET | Freq: Every day | ORAL | 1 refills | Status: AC
Start: 2015-10-25 — End: ?

## 2015-10-25 NOTE — Progress Notes (Signed)
Melody Harrison is a 60 y.o. female who was seen in clinic today (10/25/2015).      Assessment & Plan:  Diagnoses and all orders for this visit:    1. Pre-diabetes- due for labs due to weight gain, reviewed diet changes or meds if getting worse  -     METABOLIC PANEL, BASIC  -     HEMOGLOBIN A1C WITH EAG    2. Pure hypercholesterolemia- at goal, continue current treatment   -     lovastatin (MEVACOR) 20 mg tablet; TAKE 1 TABLET BY MOUTH NIGHTLY    3. Essential hypertension, benign- well controlled, continue current treatment   -     metoprolol succinate (TOPROL-XL) 25 mg XL tablet; Take 1 Tab by mouth daily.  -     METABOLIC PANEL, BASIC    4. History of tobacco use- quit in 11/16, congratulated    5. Obesity (BMI 30.0-34.9)- poorly controlled, worsening, I have reviewed/discussed the above normal BMI with the patient.  I have recommended the following interventions: encourage exercise and lifestyle education regarding diet.  Reviewed increase in PO intake due to stopping smoking and ways to curb eating.     6. 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  Will treat with: weight loss, ice, NSAIDs.  Red flags were reviewed with the patient to RTC or notify me, expected time course for resolution reviewed.  Reviewed when to consider imaging or to see ortho.          Follow-up Disposition:  Return in about 6 months (around 04/26/2016) for Regular follow up.       ----------------------------------------------------------------------    Subjective:  Melody Harrison was seen today for Hypertension; Cholesterol Problem; and High Blood Sugar    Endocrine Review  She is seen for pre-diabetes.  Since last visit: this is a new dx.  Home glucose monitoring: is not performed.   Diabetic diet compliance: compliant most of the time.  Lab review: labs reviewed and discussed with patient.       Cardiovascular Review   The patient has hypertension, hyperlipidemia and obesity.  Since last visit: she quit smoking in 11/16.  She has gained 15 lbs in the last year.  She reports taking medications as instructed, no medication side effects noted, patient does not perform home BP monitoring.  Diet and Lifestyle: generally follows a low fat low cholesterol diet, generally follows a low sodium diet, sedentary.  Labs: reviewed and discussed with patient.        Lab Results   Component Value Date/Time    Sodium 144 05/12/2015 09:37 AM    Potassium 4.6 05/12/2015 09:37 AM    Cholesterol, total 174 05/12/2015 09:37 AM    HDL Cholesterol 39 05/12/2015 09:37 AM    LDL, calculated 113 05/12/2015 09:37 AM    Triglyceride 109 05/12/2015 09:37 AM          ----------------------------------------------------------------------      Prior to Admission medications    Medication Sig Start Date End Date Taking? Authorizing Provider   lovastatin (MEVACOR) 20 mg tablet TAKE 1 TABLET BY MOUTH NIGHTLY 08/22/15  Yes Bud Face, MD   metoprolol succinate (TOPROL-XL) 25 mg XL tablet Take 1 Tab by mouth daily. 08/22/15  Yes Bud Face, MD   omega-3 fatty acids-vitamin e (FISH OIL) 1,000 mg cap Take 1 Cap by mouth daily.   Yes Historical Provider  MULTIVITAMINS-MIN/FA/GINKGO (WOMEN'S 50+ DAILY FORMULA PO) Take 1 tablet by mouth daily.   Yes Historical Provider          No Known Allergies        Review of Systems   Respiratory: Negative for cough and shortness of breath.    Cardiovascular: Negative for chest pain and palpitations.   Gastrointestinal: Negative for abdominal pain, constipation, diarrhea, nausea and vomiting.   Musculoskeletal: Positive for joint pain (bilateral knees, worse at the end of the day.  she reports clicking.  no giving out or locking up.  using ice & rest.  using tylenol rarely).         Objective:   Physical Exam   Constitutional:   obese   Cardiovascular: Regular rhythm and normal heart sounds.    No murmur heard.   Pulmonary/Chest: Effort normal and breath sounds normal. She has no wheezes. She has no rales.   Abdominal: Bowel sounds are normal. She exhibits no mass. There is no hepatosplenomegaly. There is no tenderness.   Musculoskeletal:        Right knee: She exhibits normal range of motion and no swelling. No tenderness found.        Left knee: She exhibits normal range of motion and no swelling. No tenderness found.   Grinding in R knee only w/ passive & active ROM         Visit Vitals   ??? BP 138/82   ??? Pulse 70   ??? Temp 98.7 ??F (37.1 ??C) (Oral)   ??? Resp 16   ??? Ht 5' 9.1" (1.755 m)   ??? Wt 227 lb 3.2 oz (103.1 kg)   ??? LMP 03/28/2012   ??? SpO2 98%   ??? BMI 33.45 kg/m2         Disclaimer:  Advised her to call back or return to office if symptoms worsen/change/persist.  Discussed expected course/resolution/complications of diagnosis in detail with patient.    Medication risks/benefits/costs/interactions/alternatives discussed with patient.  She was given an after visit summary which includes diagnoses, current medications, & vitals.  She expressed understanding with the diagnosis and plan.        Bud Facehristopher P Leeanna Slaby, MD

## 2015-10-25 NOTE — Progress Notes (Signed)
Follow up.  Last pap by Dr. Maurine Ministerennis 2013.

## 2015-10-25 NOTE — Patient Instructions (Signed)
Nutrition Tips for Diabetes: After Your Visit  Your Care Instructions  A healthy diet is important to manage diabetes. It helps you lose weight (if you need to) and keep it off. It gives you the nutrition and energy your body needs and helps prevent heart disease. But a diet for diabetes does not mean that you have to eat special foods. You can eat what your family eats, including occasional sweets and other favorites. But you do have to pay attention to how often you eat and how much you eat of certain foods. The right plan for you will give you meals that help you keep your blood sugar at healthy levels.  Try to eat a variety of foods and to spread carbohydrate throughout the day. Carbohydrate raises blood sugar higher and more quickly than any other nutrient does. Carbohydrate is found in sugar, breads and cereals, fruit, starchy vegetables such as potatoes and corn, and milk and yogurt.  You may want to work with a dietitian or diabetes educator to help you plan meals and snacks. A dietitian or diabetes educator also can help you lose weight if that is one of your goals. The following tips can help you enjoy your meals and stay healthy.  Follow-up care is a key part of your treatment and safety. Be sure to make and go to all appointments, and call your doctor if you are having problems. It???s also a good idea to know your test results and keep a list of the medicines you take.  How can you care for yourself at home?  ?? Learn which foods have carbohydrate and how much carbohydrate to eat. A dietitian or diabetes educator can help you learn to keep track of how much carbohydrate you eat.  ?? Spread carbohydrate throughout the day. Eat some carbohydrate at all meals, but do not eat too much at any one time.  ?? Plan meals to include food from all the food groups. These are the food groups and some example portion sizes:  ?? Grains: 1 slice of bread (1 ounce), ?? cup of cooked cereal, and 1/3 cup  of cooked pasta or rice. These have about 15 grams of carbohydrate in a serving. Choose whole grains such as whole wheat bread or crackers, oatmeal, and brown rice more often than refined grains.  ?? Fruit: 1 small fresh fruit, such as an apple or orange; ?? of a banana; ?? cup of chopped, cooked, or canned fruit; ?? cup of fruit juice; 1 cup of melon or raspberries; and 2 tablespoons of dried fruit. These have about 15 grams of carbohydrate in a serving.  ?? Dairy: 1 cup of nonfat or low-fat milk and 2/3 cup of plain yogurt. These have about 15 grams of carbohydrate in a serving.  ?? Protein foods: Beef, chicken, turkey, fish, eggs, tofu, cheese, cottage cheese, and peanut butter. A serving size of meat is 3 ounces, which is about the size of a deck of cards. Examples of meat substitute serving sizes (equal to 1 ounce of meat) are 1/4 cup of cottage cheese, 1 egg, 1 tablespoon of peanut butter, and ?? cup of tofu. These have very little or no carbohydrate per serving.  ?? Vegetables: Starchy vegetables such as ?? cup of cooked dried beans, peas, potatoes, or corn have about 15 grams of carbohydrate. Nonstarchy vegetables have very little carbohydrate, such as 1 cup of raw leafy vegetables (such as spinach), ?? cup of other vegetables (cooked or chopped), and 3/4 cup   of vegetable juice.  ?? Use the plate format to plan meals. It is a good, quick way to make sure that you have a balanced meal. It also helps you spread carbohydrate throughout the day. You divide your plate by types of foods. Put vegetables on half the plate, meat or meat substitutes on one-quarter of the plate, and a grain or starchy vegetable (such as brown rice or a potato) in the final quarter of the plate. To this you can add a small piece of fruit and 1 cup of milk or yogurt, depending on how much carbohydrate you are supposed to eat at a meal.  ?? Talk to your dietitian or diabetes educator about ways to add limited  amounts of sweets into your meal plan. You can eat these foods now and then, as long as you include the amount of carbohydrate they have in your daily carbohydrate allowance.  ?? If you drink alcohol, limit it to no more than 1 drink a day for women and 2 drinks a day for men. If you are pregnant, no amount of alcohol is known to be safe.  ?? Protein, fat, and fiber do not raise blood sugar as much as carbohydrate does. If you eat a lot of these nutrients in a meal, your blood sugar will rise more slowly than it would otherwise.  ?? Limit saturated fats, such as those from meat and dairy products. Try to replace it with monounsaturated fat, such as olive oil. This is a healthier choice because people who have diabetes are at higher-than-average risk of heart disease. But use a modest amount of olive oil. A tablespoon of olive oil has 14 grams of fat and 120 calories.  ?? Exercise lowers blood sugar. If you take insulin by shots or pump, you can use less than you would if you were not exercising. Keep in mind that timing matters. If you exercise within 1 hour after a meal, your body may need less insulin for that meal than it would if you exercised 3 hours after the meal. Test your blood sugar to find out how exercise affects your need for insulin.  ?? Exercise on most days of the week. Aim for at least 30 minutes. Exercise helps you stay at a healthy weight and helps your body use insulin. Walking is an easy way to get exercise. Gradually increase the amount you walk every day. You also may want to swim, bike, or do other activities.  When you eat out  ?? Learn to estimate the serving sizes of foods that have carbohydrate. If you measure food at home, it will be easier to estimate the amount in a serving of restaurant food.  ?? If the meal you order has too much carbohydrate (such as potatoes, corn, or baked beans), ask to have a low-carbohydrate food instead. Ask for a salad or green vegetables.   ?? If you use insulin, check your blood sugar before and after eating out to help you plan how much to eat in the future.  ?? If you eat more carbohydrate at a meal than you had planned, take a walk or do other exercise. This will help lower your blood sugar.   Where can you learn more?   Go to http://www.healthwise.net/[facility]  Enter I183 in the search box to learn more about "Nutrition Tips for Diabetes: After Your Visit."   ?? 2006-2014 [employer]. Care instructions adapted under license by [facility] (which disclaims liability or warranty for this   information). This care instruction is for use with your licensed healthcare professional. If you have questions about a medical condition or this instruction, always ask your healthcare professional. Healthwise, Incorporated disclaims any warranty or liability for your use of this information.  Content Version: 10.2.346038; Current as of: September 16, 2012

## 2015-10-26 LAB — HEMOGLOBIN A1C WITH EAG
Estimated average glucose: 114 mg/dL
Hemoglobin A1c: 5.6 % (ref 4.8–5.6)

## 2015-10-26 LAB — METABOLIC PANEL, BASIC
BUN/Creatinine ratio: 14 (ref 9–23)
BUN: 14 mg/dL (ref 6–24)
CO2: 23 mmol/L (ref 18–29)
Calcium: 9.5 mg/dL (ref 8.7–10.2)
Chloride: 103 mmol/L (ref 96–106)
Creatinine: 1.02 mg/dL — ABNORMAL HIGH (ref 0.57–1.00)
GFR est AA: 70 mL/min/{1.73_m2} (ref >59)
GFR est non-AA: 61 mL/min/{1.73_m2} (ref >59)
Glucose: 112 mg/dL — ABNORMAL HIGH (ref 65–99)
Potassium: 4.7 mmol/L (ref 3.5–5.2)
Sodium: 144 mmol/L (ref 134–144)

## 2015-10-26 NOTE — Progress Notes (Signed)
Letter sent to patient.  All labs are stable or at goal for her.  Non-fasting BS.  A1c actually improved slightly.  Will cont to list as pre-DM

## 2016-04-29 DIAGNOSIS — M19072 Primary osteoarthritis, left ankle and foot: Secondary | ICD-10-CM | POA: Diagnosis not present

## 2016-04-29 DIAGNOSIS — M722 Plantar fascial fibromatosis: Secondary | ICD-10-CM | POA: Diagnosis not present

## 2016-04-29 DIAGNOSIS — M19071 Primary osteoarthritis, right ankle and foot: Secondary | ICD-10-CM | POA: Diagnosis not present

## 2016-05-08 DIAGNOSIS — E7800 Pure hypercholesterolemia, unspecified: Secondary | ICD-10-CM | POA: Diagnosis not present

## 2016-05-08 DIAGNOSIS — I10 Essential (primary) hypertension: Secondary | ICD-10-CM | POA: Diagnosis not present

## 2016-05-08 DIAGNOSIS — E785 Hyperlipidemia, unspecified: Secondary | ICD-10-CM | POA: Diagnosis not present

## 2016-05-08 DIAGNOSIS — R7303 Prediabetes: Secondary | ICD-10-CM | POA: Diagnosis not present

## 2016-05-08 DIAGNOSIS — E669 Obesity, unspecified: Secondary | ICD-10-CM | POA: Diagnosis not present

## 2016-06-01 DIAGNOSIS — I10 Essential (primary) hypertension: Secondary | ICD-10-CM | POA: Diagnosis not present

## 2016-06-01 DIAGNOSIS — E785 Hyperlipidemia, unspecified: Secondary | ICD-10-CM | POA: Diagnosis not present

## 2016-09-04 DIAGNOSIS — I10 Essential (primary) hypertension: Secondary | ICD-10-CM | POA: Diagnosis not present

## 2016-09-04 DIAGNOSIS — M542 Cervicalgia: Secondary | ICD-10-CM | POA: Diagnosis not present

## 2016-09-04 DIAGNOSIS — E669 Obesity, unspecified: Secondary | ICD-10-CM | POA: Diagnosis not present

## 2017-01-01 DIAGNOSIS — I10 Essential (primary) hypertension: Secondary | ICD-10-CM | POA: Diagnosis not present

## 2017-01-01 DIAGNOSIS — E785 Hyperlipidemia, unspecified: Secondary | ICD-10-CM | POA: Diagnosis not present

## 2017-01-01 DIAGNOSIS — R7303 Prediabetes: Secondary | ICD-10-CM | POA: Diagnosis not present

## 2017-01-01 DIAGNOSIS — E669 Obesity, unspecified: Secondary | ICD-10-CM | POA: Diagnosis not present

## 2017-02-12 DIAGNOSIS — I10 Essential (primary) hypertension: Secondary | ICD-10-CM | POA: Diagnosis not present

## 2017-03-17 DIAGNOSIS — E669 Obesity, unspecified: Secondary | ICD-10-CM | POA: Diagnosis not present

## 2017-03-17 DIAGNOSIS — E785 Hyperlipidemia, unspecified: Secondary | ICD-10-CM | POA: Diagnosis not present

## 2017-03-17 DIAGNOSIS — I10 Essential (primary) hypertension: Secondary | ICD-10-CM | POA: Diagnosis not present

## 2017-04-30 DIAGNOSIS — R7303 Prediabetes: Secondary | ICD-10-CM | POA: Diagnosis not present

## 2017-04-30 DIAGNOSIS — E785 Hyperlipidemia, unspecified: Secondary | ICD-10-CM | POA: Diagnosis not present

## 2017-04-30 DIAGNOSIS — I10 Essential (primary) hypertension: Secondary | ICD-10-CM | POA: Diagnosis not present

## 2017-04-30 DIAGNOSIS — E669 Obesity, unspecified: Secondary | ICD-10-CM | POA: Diagnosis not present

## 2017-07-24 DIAGNOSIS — E669 Obesity, unspecified: Secondary | ICD-10-CM | POA: Diagnosis not present

## 2017-07-24 DIAGNOSIS — I10 Essential (primary) hypertension: Secondary | ICD-10-CM | POA: Diagnosis not present

## 2017-07-24 DIAGNOSIS — R7303 Prediabetes: Secondary | ICD-10-CM | POA: Diagnosis not present

## 2017-07-24 DIAGNOSIS — E785 Hyperlipidemia, unspecified: Secondary | ICD-10-CM | POA: Diagnosis not present

## 2017-10-09 DIAGNOSIS — I10 Essential (primary) hypertension: Secondary | ICD-10-CM | POA: Diagnosis not present

## 2017-10-09 DIAGNOSIS — E785 Hyperlipidemia, unspecified: Secondary | ICD-10-CM | POA: Diagnosis not present

## 2017-10-09 DIAGNOSIS — E669 Obesity, unspecified: Secondary | ICD-10-CM | POA: Diagnosis not present

## 2017-10-09 DIAGNOSIS — R7303 Prediabetes: Secondary | ICD-10-CM | POA: Diagnosis not present

## 2018-01-05 DIAGNOSIS — E669 Obesity, unspecified: Secondary | ICD-10-CM | POA: Diagnosis not present

## 2018-01-05 DIAGNOSIS — I10 Essential (primary) hypertension: Secondary | ICD-10-CM | POA: Diagnosis not present

## 2018-01-05 DIAGNOSIS — R7303 Prediabetes: Secondary | ICD-10-CM | POA: Diagnosis not present

## 2018-01-08 DIAGNOSIS — E785 Hyperlipidemia, unspecified: Secondary | ICD-10-CM | POA: Diagnosis not present

## 2018-01-08 DIAGNOSIS — Z6834 Body mass index (BMI) 34.0-34.9, adult: Secondary | ICD-10-CM | POA: Diagnosis not present

## 2018-01-08 DIAGNOSIS — I10 Essential (primary) hypertension: Secondary | ICD-10-CM | POA: Diagnosis not present

## 2018-01-08 DIAGNOSIS — E669 Obesity, unspecified: Secondary | ICD-10-CM | POA: Diagnosis not present

## 2018-01-27 DIAGNOSIS — Z1231 Encounter for screening mammogram for malignant neoplasm of breast: Secondary | ICD-10-CM | POA: Diagnosis not present

## 2018-01-28 DIAGNOSIS — L918 Other hypertrophic disorders of the skin: Secondary | ICD-10-CM | POA: Diagnosis not present

## 2018-01-28 DIAGNOSIS — L821 Other seborrheic keratosis: Secondary | ICD-10-CM | POA: Diagnosis not present

## 2018-07-03 DIAGNOSIS — I10 Essential (primary) hypertension: Secondary | ICD-10-CM | POA: Diagnosis not present

## 2018-07-03 DIAGNOSIS — R7303 Prediabetes: Secondary | ICD-10-CM | POA: Diagnosis not present

## 2018-07-03 DIAGNOSIS — E785 Hyperlipidemia, unspecified: Secondary | ICD-10-CM | POA: Diagnosis not present

## 2018-07-03 DIAGNOSIS — R635 Abnormal weight gain: Secondary | ICD-10-CM | POA: Diagnosis not present

## 2018-07-17 DIAGNOSIS — E6609 Other obesity due to excess calories: Secondary | ICD-10-CM | POA: Diagnosis not present

## 2018-07-17 DIAGNOSIS — I10 Essential (primary) hypertension: Secondary | ICD-10-CM | POA: Diagnosis not present

## 2018-07-17 DIAGNOSIS — E1169 Type 2 diabetes mellitus with other specified complication: Secondary | ICD-10-CM | POA: Diagnosis not present

## 2018-08-22 DIAGNOSIS — I10 Essential (primary) hypertension: Secondary | ICD-10-CM | POA: Diagnosis not present

## 2018-08-22 DIAGNOSIS — I110 Hypertensive heart disease with heart failure: Secondary | ICD-10-CM | POA: Diagnosis not present

## 2018-08-22 DIAGNOSIS — E669 Obesity, unspecified: Secondary | ICD-10-CM | POA: Diagnosis not present

## 2018-08-22 DIAGNOSIS — R739 Hyperglycemia, unspecified: Secondary | ICD-10-CM | POA: Diagnosis not present

## 2018-08-22 DIAGNOSIS — E785 Hyperlipidemia, unspecified: Secondary | ICD-10-CM | POA: Diagnosis not present

## 2018-08-22 DIAGNOSIS — R7303 Prediabetes: Secondary | ICD-10-CM | POA: Diagnosis not present

## 2018-11-11 DIAGNOSIS — Z03818 Encounter for observation for suspected exposure to other biological agents ruled out: Secondary | ICD-10-CM | POA: Diagnosis not present

## 2018-11-23 DIAGNOSIS — E1169 Type 2 diabetes mellitus with other specified complication: Secondary | ICD-10-CM | POA: Diagnosis not present

## 2018-11-23 DIAGNOSIS — E669 Obesity, unspecified: Secondary | ICD-10-CM | POA: Diagnosis not present

## 2018-11-23 DIAGNOSIS — I10 Essential (primary) hypertension: Secondary | ICD-10-CM | POA: Diagnosis not present

## 2019-01-29 DIAGNOSIS — Z1231 Encounter for screening mammogram for malignant neoplasm of breast: Secondary | ICD-10-CM | POA: Diagnosis not present

## 2019-03-22 DIAGNOSIS — E785 Hyperlipidemia, unspecified: Secondary | ICD-10-CM | POA: Diagnosis not present

## 2019-03-22 DIAGNOSIS — I10 Essential (primary) hypertension: Secondary | ICD-10-CM | POA: Diagnosis not present

## 2019-03-22 DIAGNOSIS — E669 Obesity, unspecified: Secondary | ICD-10-CM | POA: Diagnosis not present

## 2019-03-22 DIAGNOSIS — R7303 Prediabetes: Secondary | ICD-10-CM | POA: Diagnosis not present

## 2019-03-25 DIAGNOSIS — E1169 Type 2 diabetes mellitus with other specified complication: Secondary | ICD-10-CM | POA: Diagnosis not present

## 2019-03-25 DIAGNOSIS — E785 Hyperlipidemia, unspecified: Secondary | ICD-10-CM | POA: Diagnosis not present

## 2019-03-25 DIAGNOSIS — I10 Essential (primary) hypertension: Secondary | ICD-10-CM | POA: Diagnosis not present

## 2019-07-26 ENCOUNTER — Other Ambulatory Visit: Payer: Self-pay | Admitting: Family Medicine

## 2019-07-26 ENCOUNTER — Ambulatory Visit
Admission: RE | Admit: 2019-07-26 | Discharge: 2019-07-26 | Disposition: A | Discharge status: Home / Self Care | Payer: Federal, State, Local not specified - PPO | Source: Ambulatory Visit | Attending: Family Medicine | Admitting: Family Medicine

## 2019-07-26 DIAGNOSIS — E1169 Type 2 diabetes mellitus with other specified complication: Secondary | ICD-10-CM | POA: Diagnosis not present

## 2019-07-26 DIAGNOSIS — M545 Low back pain, unspecified: Secondary | ICD-10-CM

## 2019-07-26 DIAGNOSIS — I10 Essential (primary) hypertension: Secondary | ICD-10-CM | POA: Diagnosis not present

## 2019-07-26 DIAGNOSIS — E6609 Other obesity due to excess calories: Secondary | ICD-10-CM | POA: Diagnosis not present

## 2019-08-09 DIAGNOSIS — I10 Essential (primary) hypertension: Secondary | ICD-10-CM | POA: Diagnosis not present

## 2019-08-09 DIAGNOSIS — E669 Obesity, unspecified: Secondary | ICD-10-CM | POA: Diagnosis not present

## 2019-08-09 DIAGNOSIS — M545 Low back pain: Secondary | ICD-10-CM | POA: Diagnosis not present

## 2019-11-08 DIAGNOSIS — M545 Low back pain: Secondary | ICD-10-CM | POA: Diagnosis not present

## 2019-11-08 DIAGNOSIS — I10 Essential (primary) hypertension: Secondary | ICD-10-CM | POA: Diagnosis not present

## 2019-11-08 DIAGNOSIS — E1169 Type 2 diabetes mellitus with other specified complication: Secondary | ICD-10-CM | POA: Diagnosis not present

## 2019-11-08 DIAGNOSIS — E119 Type 2 diabetes mellitus without complications: Secondary | ICD-10-CM | POA: Diagnosis not present

## 2019-11-08 DIAGNOSIS — N340 Urethral abscess: Secondary | ICD-10-CM | POA: Diagnosis not present

## 2019-11-08 DIAGNOSIS — N390 Urinary tract infection, site not specified: Secondary | ICD-10-CM | POA: Diagnosis not present

## 2019-11-23 DIAGNOSIS — E6609 Other obesity due to excess calories: Secondary | ICD-10-CM | POA: Diagnosis not present

## 2019-11-23 DIAGNOSIS — I10 Essential (primary) hypertension: Secondary | ICD-10-CM | POA: Diagnosis not present

## 2019-11-23 DIAGNOSIS — N390 Urinary tract infection, site not specified: Secondary | ICD-10-CM | POA: Diagnosis not present

## 2019-12-07 DIAGNOSIS — E1169 Type 2 diabetes mellitus with other specified complication: Secondary | ICD-10-CM | POA: Diagnosis not present

## 2019-12-07 DIAGNOSIS — I10 Essential (primary) hypertension: Secondary | ICD-10-CM | POA: Diagnosis not present

## 2019-12-07 DIAGNOSIS — E785 Hyperlipidemia, unspecified: Secondary | ICD-10-CM | POA: Diagnosis not present

## 2020-01-31 DIAGNOSIS — Z1231 Encounter for screening mammogram for malignant neoplasm of breast: Secondary | ICD-10-CM | POA: Diagnosis not present

## 2020-02-16 DIAGNOSIS — Z20822 Contact with and (suspected) exposure to covid-19: Secondary | ICD-10-CM | POA: Diagnosis not present

## 2020-04-12 DIAGNOSIS — E785 Hyperlipidemia, unspecified: Secondary | ICD-10-CM | POA: Diagnosis not present

## 2020-04-12 DIAGNOSIS — E1169 Type 2 diabetes mellitus with other specified complication: Secondary | ICD-10-CM | POA: Diagnosis not present

## 2020-04-12 DIAGNOSIS — I10 Essential (primary) hypertension: Secondary | ICD-10-CM | POA: Diagnosis not present

## 2020-04-17 DIAGNOSIS — E1169 Type 2 diabetes mellitus with other specified complication: Secondary | ICD-10-CM | POA: Diagnosis not present

## 2020-04-17 DIAGNOSIS — E785 Hyperlipidemia, unspecified: Secondary | ICD-10-CM | POA: Diagnosis not present

## 2020-04-17 DIAGNOSIS — E6609 Other obesity due to excess calories: Secondary | ICD-10-CM | POA: Diagnosis not present

## 2020-09-28 DIAGNOSIS — I10 Essential (primary) hypertension: Secondary | ICD-10-CM | POA: Diagnosis not present

## 2020-09-28 DIAGNOSIS — E1169 Type 2 diabetes mellitus with other specified complication: Secondary | ICD-10-CM | POA: Diagnosis not present

## 2020-09-28 DIAGNOSIS — E669 Obesity, unspecified: Secondary | ICD-10-CM | POA: Diagnosis not present

## 2021-01-29 DIAGNOSIS — R739 Hyperglycemia, unspecified: Secondary | ICD-10-CM | POA: Diagnosis not present

## 2021-01-29 DIAGNOSIS — I10 Essential (primary) hypertension: Secondary | ICD-10-CM | POA: Diagnosis not present

## 2021-01-29 DIAGNOSIS — E1169 Type 2 diabetes mellitus with other specified complication: Secondary | ICD-10-CM | POA: Diagnosis not present

## 2021-01-29 DIAGNOSIS — E669 Obesity, unspecified: Secondary | ICD-10-CM | POA: Diagnosis not present

## 2021-01-29 DIAGNOSIS — E785 Hyperlipidemia, unspecified: Secondary | ICD-10-CM | POA: Diagnosis not present

## 2021-01-29 DIAGNOSIS — R7303 Prediabetes: Secondary | ICD-10-CM | POA: Diagnosis not present

## 2021-01-31 DIAGNOSIS — Z1231 Encounter for screening mammogram for malignant neoplasm of breast: Secondary | ICD-10-CM | POA: Diagnosis not present

## 2021-02-17 ENCOUNTER — Other Ambulatory Visit: Payer: Self-pay

## 2021-02-17 DIAGNOSIS — R252 Cramp and spasm: Secondary | ICD-10-CM

## 2021-02-27 DIAGNOSIS — R252 Cramp and spasm: Secondary | ICD-10-CM | POA: Diagnosis not present

## 2021-02-27 DIAGNOSIS — E785 Hyperlipidemia, unspecified: Secondary | ICD-10-CM | POA: Diagnosis not present

## 2021-02-27 DIAGNOSIS — R7303 Prediabetes: Secondary | ICD-10-CM | POA: Diagnosis not present

## 2021-02-27 NOTE — Progress Notes (Deleted)
Office Note     CC: New onset leg cramps and spasms. Requesting Provider:  Renaye Rakers, MD  HPI: Gina Mcmahon is a 65 y.o. (Jun 05, 1955) female presenting at the request of .Renaye Rakers, MD ***  The pt is  on a statin for cholesterol management.  The pt is no on a daily aspirin.   Other AC:  - The pt is  on medication for hypertension.   The pt is  diabetic.  Tobacco hx:  former  Past Medical History:  Diagnosis Date   Hyperlipidemia    Lipid disorder    Stress     *** The histories are not reviewed yet. Please review them in the "History" navigator section and refresh this SmartLink.  Social History   Socioeconomic History   Marital status: Married    Spouse name: Not on file   Number of children: Not on file   Years of education: Not on file   Highest education level: Some college, no degree  Occupational History   Occupation: Scientist, water quality: [employer]  Tobacco Use   Smoking status: Former    Types: Cigarettes   Smokeless tobacco: Not on file  Substance and Sexual Activity   Alcohol use: Not on file   Drug use: Never   Sexual activity: Not on file  Other Topics Concern   Not on file  Social History Narrative   Not on file   Social Determinants of Health   Financial Resource Strain: Not on file  Food Insecurity: Not on file  Transportation Needs: Not on file  Physical Activity: Not on file  Stress: Not on file  Social Connections: Not on file  Intimate Partner Violence: Not on file   *** Family History  Problem Relation Age of Onset   Heart disease Mother    Cancer Father        Prostate CA   Cancer Sister        Breast CA   Gout Sister     Current Outpatient Medications  Medication Sig Dispense Refill   ciprofloxacin (CIPRO) 250 MG tablet Take 250 mg by mouth 2 (two) times daily.     diclofenac Sodium (VOLTAREN) 1 % GEL Apply topically 4 (four) times daily.     Insulin Pen Needle (NOVOFINE PEN NEEDLE) 32G X 6 MM MISC  by Does not apply route.     lovastatin (MEVACOR) 20 MG tablet Take 20 mg by mouth at bedtime.     meloxicam (MOBIC) 15 MG tablet Take 15 mg by mouth daily.     metoprolol succinate (TOPROL-XL) 25 MG 24 hr tablet Take 25 mg by mouth daily.     Multiple Vitamins-Minerals (ALIVE MULTI-VITAMIN PO) Take by mouth.     nitrofurantoin, macrocrystal-monohydrate, (MACROBID) 100 MG capsule Take 100 mg by mouth 2 (two) times daily.     Omega-3 Fatty Acids (FISH OIL PO) Take by mouth.     Semaglutide,0.25 or 0.5MG /DOS, (OZEMPIC, 0.25 OR 0.5 MG/DOSE,) 2 MG/1.5ML SOPN Inject into the skin.     spironolactone (ALDACTONE) 25 MG tablet Take 25 mg by mouth daily.     No current facility-administered medications for this visit.    No Known Allergies   REVIEW OF SYSTEMS:  *** [X]  denotes positive finding, [ ]  denotes negative finding Cardiac  Comments:  Chest pain or chest pressure:    Shortness of breath upon exertion:    Short of breath when lying flat:    Irregular  heart rhythm:        Vascular    Pain in calf, thigh, or hip brought on by ambulation:    Pain in feet at night that wakes you up from your sleep:     Blood clot in your veins:    Leg swelling:         Pulmonary    Oxygen at home:    Productive cough:     Wheezing:         Neurologic    Sudden weakness in arms or legs:     Sudden numbness in arms or legs:     Sudden onset of difficulty speaking or slurred speech:    Temporary loss of vision in one eye:     Problems with dizziness:         Gastrointestinal    Blood in stool:     Vomited blood:         Genitourinary    Burning when urinating:     Blood in urine:        Psychiatric    Major depression:         Hematologic    Bleeding problems:    Problems with blood clotting too easily:        Skin    Rashes or ulcers:        Constitutional    Fever or chills:      PHYSICAL EXAMINATION:  There were no vitals filed for this visit.  General:  WDWN in NAD;  vital signs documented above Gait: Not observed HENT: WNL, normocephalic Pulmonary: normal non-labored breathing , without Rales, rhonchi,  wheezing Cardiac: {Desc; regular/irreg:14544} HR, without  Murmurs {With/Without:20273} carotid bruit*** Abdomen: soft, NT, no masses Skin: {With/Without:20273} rashes Vascular Exam/Pulses:  Right Left  Radial {Exam; arterial pulse strength 0-4:30167} {Exam; arterial pulse strength 0-4:30167}  Ulnar {Exam; arterial pulse strength 0-4:30167} {Exam; arterial pulse strength 0-4:30167}  Femoral {Exam; arterial pulse strength 0-4:30167} {Exam; arterial pulse strength 0-4:30167}  Popliteal {Exam; arterial pulse strength 0-4:30167} {Exam; arterial pulse strength 0-4:30167}  DP {Exam; arterial pulse strength 0-4:30167} {Exam; arterial pulse strength 0-4:30167}  PT {Exam; arterial pulse strength 0-4:30167} {Exam; arterial pulse strength 0-4:30167}   Extremities: {With/Without:20273} ischemic changes, {With/Without:20273} Gangrene , {With/Without:20273} cellulitis; {With/Without:20273} open wounds;  Musculoskeletal: no muscle wasting or atrophy  Neurologic: A&O X 3;  No focal weakness or paresthesias are detected Psychiatric:  The pt has {Desc; normal/abnormal:11317::"Normal"} affect.   Non-Invasive Vascular Imaging:   ***    ASSESSMENT/PLAN: Gina Mcmahon is a 65 y.o. female presenting with ***   ***  Recommend the following which can slow the progression of atherosclerosis and reduce the risk of major adverse cardiac / limb events:  Aspirin 81mg  PO QD.  Atorvastatin 40-80mg  PO QD (or other "high intensity" statin therapy). Complete cessation from all tobacco products. Blood glucose control with goal A1c < 7%. Blood pressure control with goal blood pressure < 140/90 mmHg. Lipid reduction therapy with goal LDL-C <100 mg/dL ( if symptomatic from PAD).    <02, MD [facility] [phone]

## 2021-03-02 ENCOUNTER — Encounter (HOSPITAL_COMMUNITY): Payer: Federal, State, Local not specified - PPO

## 2021-03-02 ENCOUNTER — Encounter: Payer: Federal, State, Local not specified - PPO | Admitting: Vascular Surgery

## 2021-03-05 ENCOUNTER — Encounter (HOSPITAL_COMMUNITY): Payer: Federal, State, Local not specified - PPO

## 2021-03-05 ENCOUNTER — Encounter: Payer: Federal, State, Local not specified - PPO | Admitting: Vascular Surgery

## 2021-04-04 ENCOUNTER — Other Ambulatory Visit: Payer: Self-pay

## 2021-04-04 ENCOUNTER — Ambulatory Visit: Payer: Federal, State, Local not specified - PPO | Admitting: Vascular Surgery

## 2021-04-04 ENCOUNTER — Encounter: Payer: Self-pay | Admitting: Vascular Surgery

## 2021-04-04 ENCOUNTER — Ambulatory Visit (HOSPITAL_COMMUNITY)
Admission: RE | Admit: 2021-04-04 | Discharge: 2021-04-04 | Disposition: A | Discharge status: Home / Self Care | Payer: Federal, State, Local not specified - PPO | Source: Ambulatory Visit | Attending: Vascular Surgery | Admitting: Vascular Surgery

## 2021-04-04 VITALS — BP 142/75 | HR 68 | Temp 97.7°F | Resp 20 | Ht 70.0 in | Wt 239.0 lb

## 2021-04-04 DIAGNOSIS — R252 Cramp and spasm: Secondary | ICD-10-CM | POA: Insufficient documentation
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
## 2021-09-21 IMAGING — CR DG LUMBAR SPINE COMPLETE 4+V
6 series · 6 of 6 positions shown · non-contrast
Comparison: None.

CLINICAL DATA: Chronic low back pain

EXAM:
LUMBAR SPINE - COMPLETE 4+ VIEW

[t l-spine a.p.]
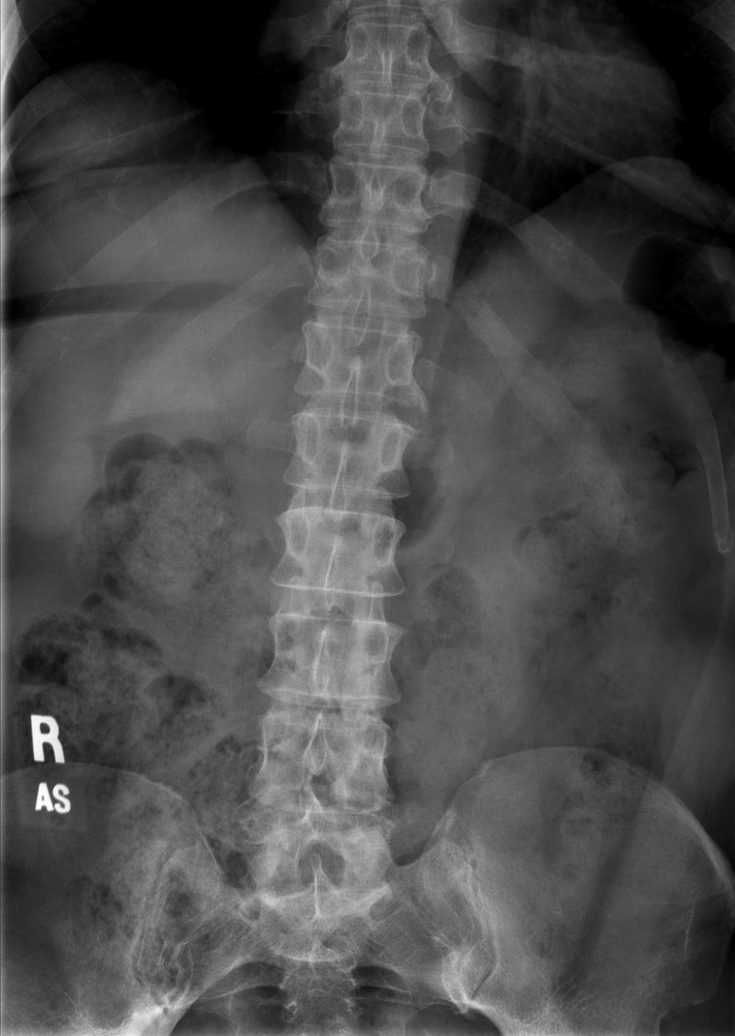

[t l-spine oblique exposure * (1 of 2)]
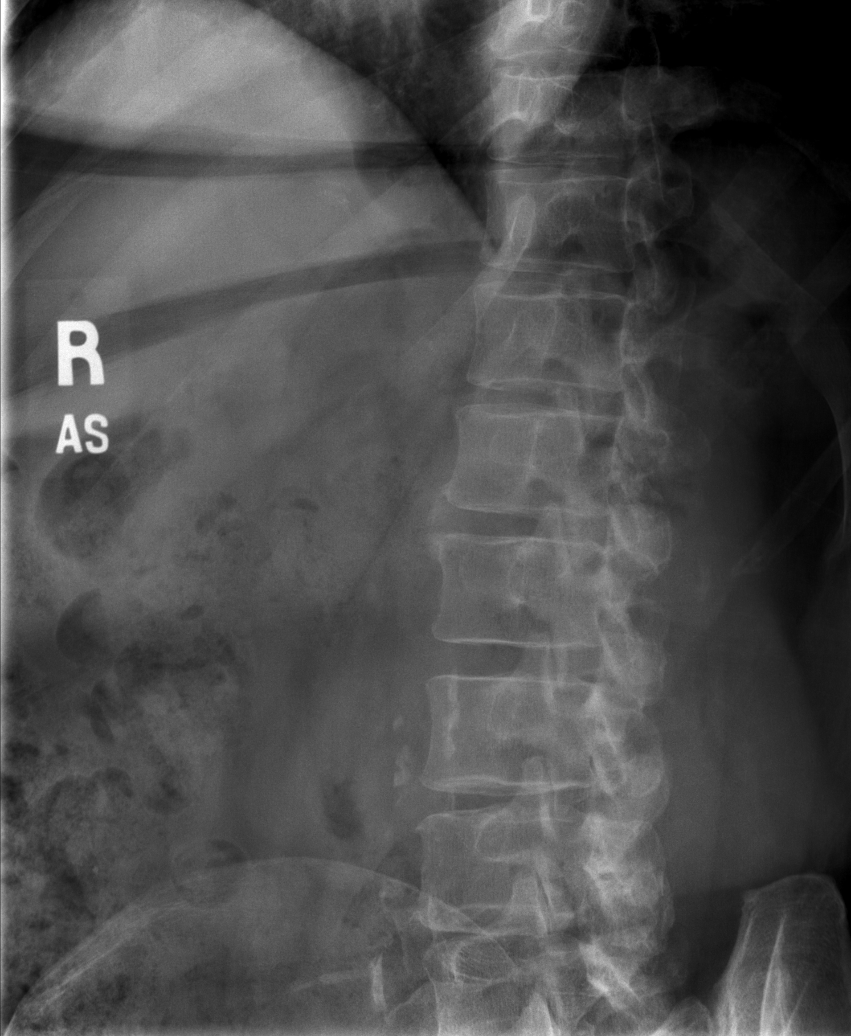

[t l-spine oblique exposure]
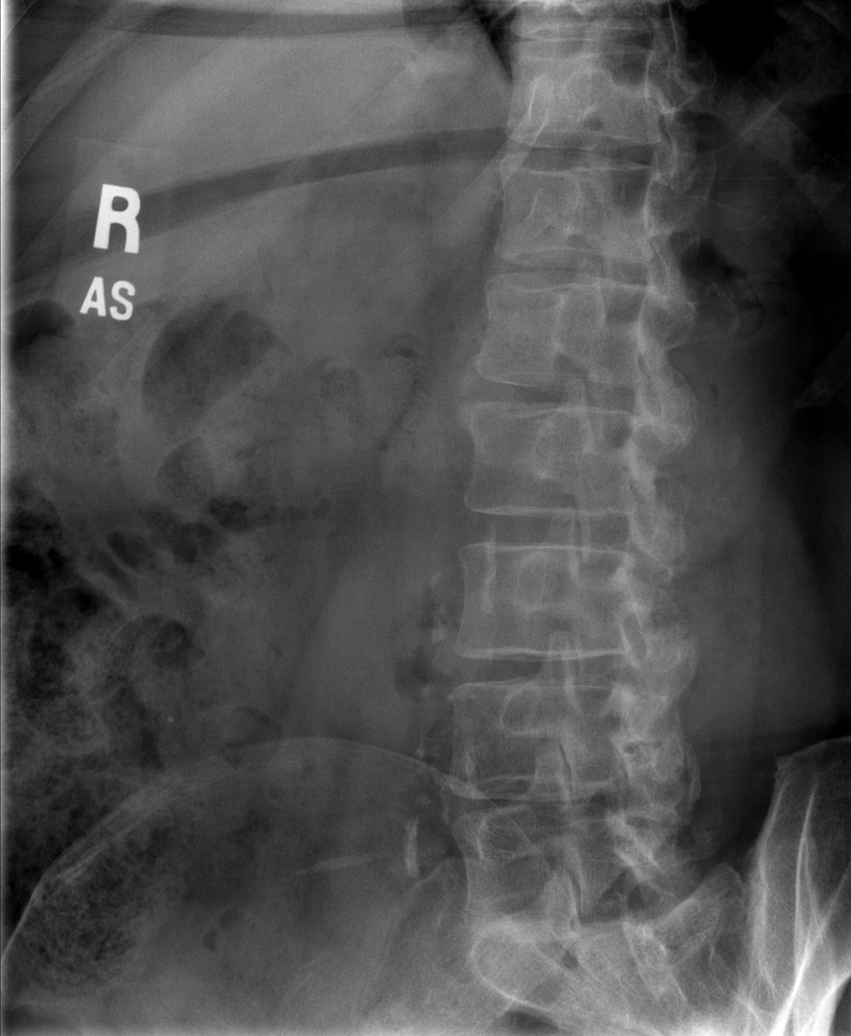

[t l-spine oblique exposure * (2 of 2)]
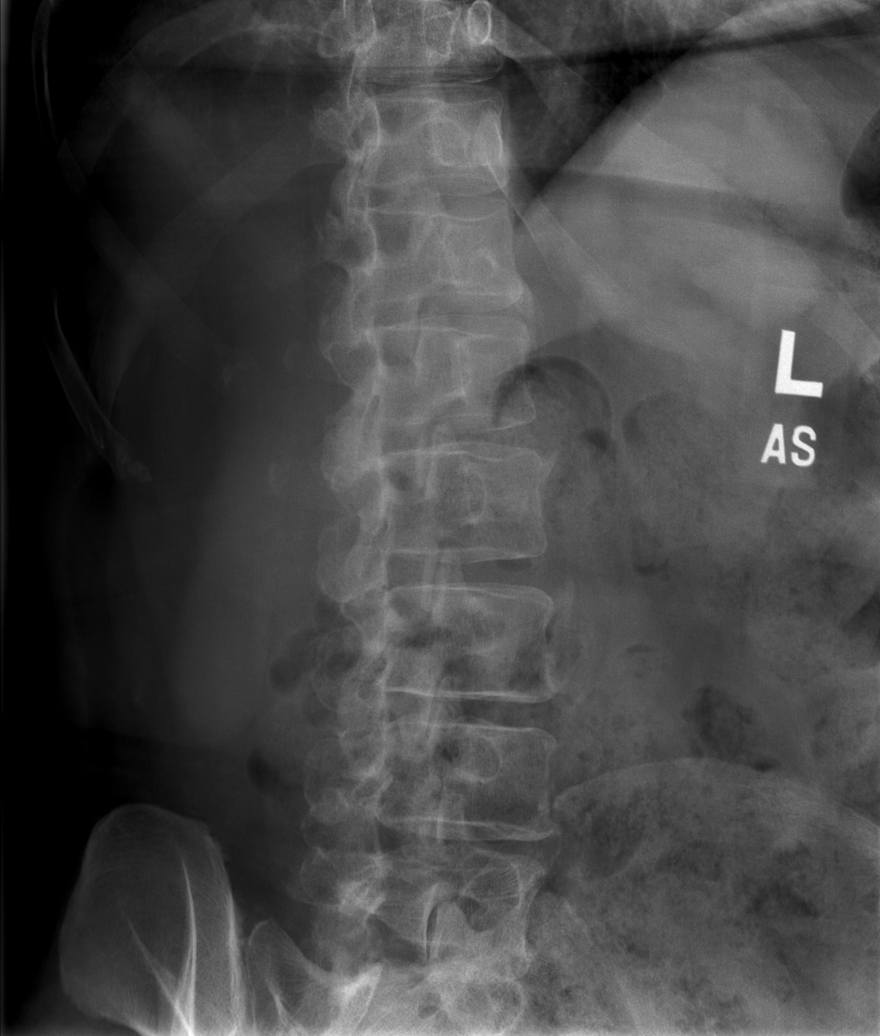

[t l-spine lat *]
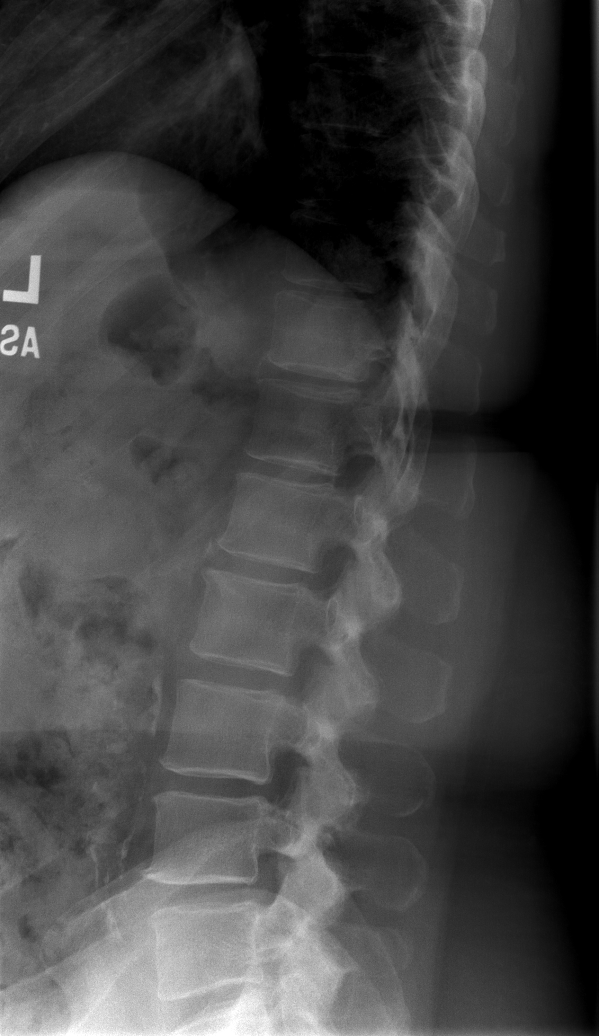

[t l-spine l5-s1 spot]
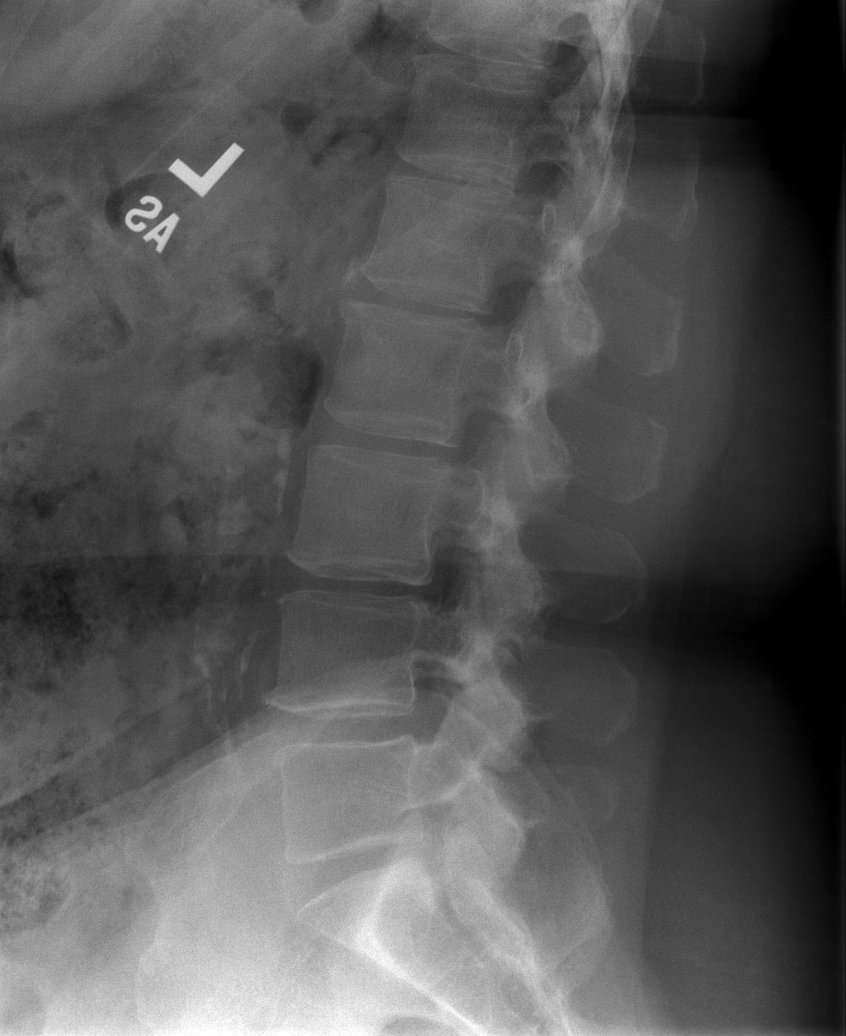

[6 of 6 positions shown; findings below may reference images not displayed]

FINDINGS: Degenerative facet disease diffusely throughout the lumbar spine.
Disc spaces maintained. No fracture. Normal alignment. SI joints
symmetric and unremarkable.
IMPRESSION: Diffuse degenerative facet disease.  No acute bony abnormality.

## 2023-01-09 ENCOUNTER — Other Ambulatory Visit: Payer: Self-pay | Admitting: Family Medicine

## 2023-01-09 ENCOUNTER — Other Ambulatory Visit (HOSPITAL_COMMUNITY)
Admission: RE | Admit: 2023-01-09 | Discharge: 2023-01-09 | Disposition: A | Discharge status: Home / Self Care | Payer: Medicare Other | Source: Ambulatory Visit | Attending: Family Medicine | Admitting: Family Medicine

## 2023-01-09 DIAGNOSIS — Z01411 Encounter for gynecological examination (general) (routine) with abnormal findings: Secondary | ICD-10-CM | POA: Diagnosis present

## 2023-01-09 DIAGNOSIS — N898 Other specified noninflammatory disorders of vagina: Secondary | ICD-10-CM | POA: Insufficient documentation

## 2023-01-09 DIAGNOSIS — Z113 Encounter for screening for infections with a predominantly sexual mode of transmission: Secondary | ICD-10-CM | POA: Diagnosis not present

## 2023-01-09 DIAGNOSIS — Z1151 Encounter for screening for human papillomavirus (HPV): Secondary | ICD-10-CM | POA: Insufficient documentation

## 2023-01-09 DIAGNOSIS — Z124 Encounter for screening for malignant neoplasm of cervix: Secondary | ICD-10-CM | POA: Insufficient documentation

## 2023-01-15 LAB — MOLECULAR ANCILLARY ONLY
Bacterial Vaginitis (gardnerella): NEGATIVE
Candida Glabrata: NEGATIVE
Candida Vaginitis: NEGATIVE
Chlamydia: NEGATIVE
Comment: NEGATIVE
Comment: NEGATIVE
Comment: NEGATIVE
Comment: NEGATIVE
Comment: NEGATIVE
Comment: NORMAL
Neisseria Gonorrhea: NEGATIVE
Trichomonas: NEGATIVE

## 2023-01-15 LAB — CYTOLOGY - PAP
Comment: NEGATIVE
Comment: NEGATIVE
Diagnosis: UNDETERMINED — AB
HSV1: NEGATIVE
HSV2: NEGATIVE
High risk HPV: NEGATIVE
